# Patient Record
Sex: Female | Born: 1985 | Race: White | Hispanic: No | Marital: Single | Smoking: Former smoker
Health system: Southern US, Community
[De-identification: ages and names within clinical notes are randomized; demographics above are authoritative.]

## PROBLEM LIST (undated history)

## (undated) DIAGNOSIS — I10 Essential (primary) hypertension: Secondary | ICD-10-CM

## (undated) DIAGNOSIS — F1910 Other psychoactive substance abuse, uncomplicated: Secondary | ICD-10-CM

## (undated) HISTORY — PX: WISDOM TOOTH EXTRACTION: SHX21

## (undated) HISTORY — DX: Essential (primary) hypertension: I10

## (undated) HISTORY — DX: Other psychoactive substance abuse, uncomplicated: F19.10

---

## 2006-10-24 ENCOUNTER — Emergency Department (HOSPITAL_COMMUNITY): Admission: EM | Admit: 2006-10-24 | Discharge: 2006-10-24 | Payer: Self-pay | Admitting: Emergency Medicine

## 2007-05-05 ENCOUNTER — Ambulatory Visit: Payer: Self-pay | Admitting: Emergency Medicine

## 2009-06-18 ENCOUNTER — Inpatient Hospital Stay (HOSPITAL_COMMUNITY): Admission: AD | Admit: 2009-06-18 | Discharge: 2009-06-21 | Payer: Self-pay | Admitting: Obstetrics and Gynecology

## 2009-06-20 ENCOUNTER — Encounter (INDEPENDENT_AMBULATORY_CARE_PROVIDER_SITE_OTHER): Payer: Self-pay | Admitting: Obstetrics and Gynecology

## 2011-02-16 LAB — COMPREHENSIVE METABOLIC PANEL
ALT: 12 U/L (ref 0–35)
AST: 21 U/L (ref 0–37)
Alkaline Phosphatase: 151 U/L — ABNORMAL HIGH (ref 39–117)
BUN: 2 mg/dL — ABNORMAL LOW (ref 6–23)
CO2: 26 mEq/L (ref 19–32)
Calcium: 8.3 mg/dL — ABNORMAL LOW (ref 8.4–10.5)
Chloride: 107 mEq/L (ref 96–112)
Creatinine, Ser: 0.64 mg/dL (ref 0.4–1.2)
GFR calc Af Amer: >60 mL/min (ref >60)
GFR calc non Af Amer: >60 mL/min (ref >60)
Glucose, Bld: 90 mg/dL (ref 70–99)
Glucose, Bld: 92 mg/dL (ref 70–99)
Potassium: 2.8 mEq/L — ABNORMAL LOW (ref 3.5–5.1)
Sodium: 137 mEq/L (ref 135–145)
Total Bilirubin: 0.4 mg/dL (ref 0.3–1.2)
Total Protein: 6 g/dL (ref 6.0–8.3)

## 2011-02-16 LAB — CBC
HCT: 33.2 % — ABNORMAL LOW (ref 36.0–46.0)
Hemoglobin: 10.6 g/dL — ABNORMAL LOW (ref 12.0–15.0)
Hemoglobin: 11.3 g/dL — ABNORMAL LOW (ref 12.0–15.0)
Hemoglobin: 9 g/dL — ABNORMAL LOW (ref 12.0–15.0)
MCHC: 34 g/dL (ref 30.0–36.0)
MCV: 89.3 fL (ref 78.0–100.0)
RBC: 2.95 MIL/uL — ABNORMAL LOW (ref 3.87–5.11)
RBC: 3.49 MIL/uL — ABNORMAL LOW (ref 3.87–5.11)
RBC: 3.72 MIL/uL — ABNORMAL LOW (ref 3.87–5.11)
RDW: 14.1 % (ref 11.5–15.5)

## 2011-02-16 LAB — LACTATE DEHYDROGENASE: LDH: 117 U/L (ref 94–250)

## 2011-02-16 LAB — URIC ACID: Uric Acid, Serum: 5.3 mg/dL (ref 2.4–7.0)

## 2017-12-21 ENCOUNTER — Emergency Department (HOSPITAL_COMMUNITY)
Admission: EM | Admit: 2017-12-21 | Discharge: 2017-12-21 | Discharge status: Against Medical Advice | Payer: Self-pay | Attending: Emergency Medicine | Admitting: Emergency Medicine

## 2017-12-21 ENCOUNTER — Other Ambulatory Visit: Payer: Self-pay

## 2017-12-21 DIAGNOSIS — R51 Headache: Secondary | ICD-10-CM | POA: Insufficient documentation

## 2017-12-21 DIAGNOSIS — M6281 Muscle weakness (generalized): Secondary | ICD-10-CM | POA: Insufficient documentation

## 2017-12-21 DIAGNOSIS — R569 Unspecified convulsions: Secondary | ICD-10-CM | POA: Insufficient documentation

## 2017-12-21 NOTE — ED Provider Notes (Signed)
[facility] EMERGENCY DEPARTMENT Provider Note   CSN: [id number] Arrival date & time: 12/21/17  1417     History   Chief Complaint Chief Complaint  Patient presents with  . Loss of Consciousness    HPI Madelin RearShelley Carlin is a 32 y.o. female.  The history is provided by the patient and medical records. No language interpreter was used.  Loss of Consciousness   Associated symptoms include headaches, seizures and weakness.   Madelin RearShelley Bashor is a 32 y.o. female who presents to ER for seizure-like activity just prior to arrival. Witnesses report that she started shaking like a seizure and passed out. No history of seizures or LOC. No medications given PTA for symptoms.   No past medical history on file.  There are no active problems to display for this patient.    OB History    No data available       Home Medications    Prior to Admission medications   Not on File    Family History No family history on file.  Social History Social History   Tobacco Use  . Smoking status: Not on file  Substance Use Topics  . Alcohol use: Not on file  . Drug use: Not on file     Allergies   Patient has no known allergies.   Review of Systems Review of Systems  Cardiovascular: Positive for syncope.  Musculoskeletal: Negative for arthralgias and myalgias.  Neurological: Positive for seizures, weakness and headaches.     Physical Exam Updated Vital Signs BP 118/70 (BP Location: Right Arm)   Pulse 95   Temp 98.5 F (36.9 C) (Oral)   Resp 18   SpO2 98%   Physical Exam  Constitutional: She is oriented to person, place, and time. She appears well-developed and well-nourished. No distress.  HENT:  Head: Normocephalic and atraumatic.  Eyes: EOM are normal.  Neck: Neck supple.  Neurological: She is alert and oriented to person, place, and time.  Skin: Skin is warm and dry.  Psychiatric: She has a normal mood and affect.  Nursing note and vitals  reviewed.    ED Treatments / Results  Labs (all labs ordered are listed, but only abnormal results are displayed) Labs Reviewed - No data to display  EKG  EKG Interpretation None       Radiology No results found.  Procedures Procedures (including critical care time)  Medications Ordered in ED Medications - No data to display   Initial Impression / Assessment and Plan / ED Course  I have reviewed the triage vital signs and the nursing notes.  Pertinent labs & imaging results that were available during my care of the patient were reviewed by me and considered in my medical decision making (see chart for details).    Patient just brought in by EMS for syncopal episode. Nursing staff was attempting to place patient in room, however she would like to leave. I came to evaluate her at bedside. Bystanders informed her that she had a seizure. She has no history of seizures or previous syncopal episodes. I have discussed my concerns as a provider given new onset of seizure vs. Syncopal episodes. We discussed the nature, risks and benefits. I have specifically discussed that without further evaluation I cannot guarantee there is not a life threatening event occuring.  Time was given to allow the opportunity to ask questions and consider the options, and after the discussion, the patient decided to refuse further evaluation. Pt is  A&Ox4, her own POA and states understanding of my concerns and the possible consequences. After refusal, I made every reasonable opportunity to treat them to the best of my ability. I have made the patient aware that this is an AMA discharge, but he may return at any time for further evaluation and treatment.  Final Clinical Impressions(s) / ED Diagnoses   Final diagnoses:  None    ED Discharge Orders    None       Ward, Chase Picket, PA-C 12/21/17 1619    Azalia Bilis, MD 12/28/17 [phone]

## 2017-12-21 NOTE — ED Triage Notes (Signed)
[facility] arrives via EMs from [facility] [employer] where pt recalled getting lightheaded, fell face forward, assisted to the ground by coworkers who noted generalized tonic/clonic jerking for approx 1.395min. No previous hx of seizures. On EMS arrival, pt alert, oriented x2. CBg 101. Blood noted to left nare. C-colllar in place. Pt alert oriented x4, at present. 20g LAC. Wants to sign out AMA due to not having health insurance. VSS.

## 2017-12-21 NOTE — ED Provider Notes (Deleted)
Patient just brought in by EMS for syncopal episode. Nursing staff was attempting to place patient in room, however she would like to leave. I came to evaluate her at bedside. Bystanders informed her that she had a seizure. She has no history of seizures or previous syncopal episodes. I have discussed my concerns as a provider given new onset of seizure vs. Syncopal episodes. We discussed the nature, risks and benefits. I have specifically discussed that without further evaluation I cannot guarantee there is not a life threatening event occuring.  Time was given to allow the opportunity to ask questions and consider the options, and after the discussion, the patient decided to refuse further evaluation. Pt is A&Ox4, her own POA and states understanding of my concerns and the possible consequences. After refusal, I made every reasonable opportunity to treat them to the best of my ability. I have made the patient aware that this is an AMA discharge, but he may return at any time for further evaluation and treatment.   Dafne Nield, Chase PicketJaime Pilcher, PA-C 12/21/17 [phone]

## 2017-12-21 NOTE — ED Notes (Signed)
Patient states that she does not want to be seen because she does not have heath insurance. Nurse discussed concerns and asked if provider could speak with her. Provider Asher MuirJamie at bedside.

## 2018-08-16 DIAGNOSIS — F411 Generalized anxiety disorder: Secondary | ICD-10-CM

## 2018-08-16 DIAGNOSIS — F1020 Alcohol dependence, uncomplicated: Secondary | ICD-10-CM | POA: Insufficient documentation

## 2018-08-16 DIAGNOSIS — F3162 Bipolar disorder, current episode mixed, moderate: Secondary | ICD-10-CM

## 2018-08-27 ENCOUNTER — Encounter: Payer: Self-pay | Admitting: Physician Assistant

## 2018-08-27 ENCOUNTER — Ambulatory Visit (INDEPENDENT_AMBULATORY_CARE_PROVIDER_SITE_OTHER): Payer: Self-pay | Admitting: Physician Assistant

## 2018-08-27 DIAGNOSIS — Z915 Personal history of self-harm: Secondary | ICD-10-CM

## 2018-08-27 DIAGNOSIS — Z9151 Personal history of suicidal behavior: Secondary | ICD-10-CM

## 2018-08-27 DIAGNOSIS — Z87898 Personal history of other specified conditions: Secondary | ICD-10-CM

## 2018-08-27 DIAGNOSIS — F411 Generalized anxiety disorder: Secondary | ICD-10-CM

## 2018-08-27 DIAGNOSIS — F1991 Other psychoactive substance use, unspecified, in remission: Secondary | ICD-10-CM | POA: Insufficient documentation

## 2018-08-27 DIAGNOSIS — F319 Bipolar disorder, unspecified: Secondary | ICD-10-CM

## 2018-08-27 MED ORDER — NALTREXONE HCL 50 MG PO TABS: 50.0000 mg | ORAL_TABLET | Freq: Every day | ORAL | 0 refills | Status: DC

## 2018-08-27 MED ORDER — QUETIAPINE FUMARATE 200 MG PO TABS: 200.0000 mg | ORAL_TABLET | Freq: Every day | ORAL | 0 refills | Status: DC
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
## 2018-09-02 ENCOUNTER — Ambulatory Visit: Payer: Self-pay | Admitting: Psychiatry

## 2018-09-02 DIAGNOSIS — F411 Generalized anxiety disorder: Secondary | ICD-10-CM
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
## 2018-09-06 ENCOUNTER — Encounter: Payer: Self-pay | Admitting: Psychiatry

## 2018-09-21 ENCOUNTER — Encounter

## 2018-09-21 ENCOUNTER — Ambulatory Visit (INDEPENDENT_AMBULATORY_CARE_PROVIDER_SITE_OTHER): Payer: Self-pay | Admitting: Psychiatry

## 2018-09-21 DIAGNOSIS — F411 Generalized anxiety disorder: Secondary | ICD-10-CM

## 2018-09-21 NOTE — Progress Notes (Signed)
      [facility] Counselor/Therapist Progress Note   Patient ID: Kelly Moses, MRN: [id number]  Date: 09/21/2018  Timespent: 555   Treatment Type: Individual   Reported Symptoms: anxiety, overwhelmed   Mental Status Exam:    Appearance:   Casual     Behavior:  Appropriate and Sharing  Motor:  Normal  Speech/Language:   Normal Rate  Affect:  Congruent  Mood:  anxious and depressed  Thought process:  normal  Thought content:    WNL  Sensory/Perceptual disturbances:    WNL  Orientation:  oriented to person, place, time/date, situation, day of week, month of year and year  Attention:  Good  Concentration:  Good  Memory:  WNL  Fund of knowledge:   Good  Insight:    Good  Judgment:   Good  Impulse Control:  Good     Risk Assessment: Danger to Self:  No Self-injurious Behavior: No Danger to Others: No Duty to Warn:no Physical Aggression / Violence:No  Access to Firearms a concern: No  Gang Involvement:No    Subjective: Patient in today with anxiety and feeling overwhelmed.  Has remained sober however is very behind in bills and other responsibilities.  Talked about steps she can take to take care of such personal business items and where to get more information.  Seems to be doing real well with her 269 yr old daughter and daughter's dad is doing a great job of co-parenting.    Interventions: Solution-Oriented/Positive Psychology and Ego-Supportive   Diagnosis:   ICD-10-CM   1. 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  Will work on specific communication with 569 yr old daughter per our conversation today.  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  Work on prioritizing the areas that need her attention the most as she works to get caught up.  Encouraged to keep attending her daily AA meetings as she has stayed sober 2 months.   Mathis Fareeborah Cigi Bega,  LCSW

## 2018-10-14 ENCOUNTER — Encounter: Payer: Self-pay | Admitting: Emergency Medicine

## 2018-10-27 ENCOUNTER — Ambulatory Visit: Payer: Self-pay | Admitting: Physician Assistant

## 2018-11-05 ENCOUNTER — Ambulatory Visit (INDEPENDENT_AMBULATORY_CARE_PROVIDER_SITE_OTHER): Payer: Self-pay | Admitting: Psychiatry

## 2018-11-05 DIAGNOSIS — F411 Generalized anxiety disorder: Secondary | ICD-10-CM

## 2018-11-05 NOTE — Progress Notes (Signed)
      [facility] Counselor/Therapist Progress Note  Patient ID: Kelly Moses, MRN: [id number],    Date: 11/05/2018  Time Spent:  50 minutes  Treatment Type: Individual Therapy  Reported Symptoms: anxiety, some depression,   Mental Status Exam:  Appearance:   Neat     Behavior:  Appropriate and Sharing  Motor:  Normal  Speech/Language:   Normal Rate  Affect:  Congruent  Mood:  anxious, depressed and irritable  Thought process:  normal  Thought content:    WNL  Sensory/Perceptual disturbances:    WNL  Orientation:  oriented to person, place, time/date, situation, day of week, month of year and year  Attention:  Good  Concentration:  Good  Memory:  WNL  Fund of knowledge:   Good  Insight:    Good  Judgment:   Good  Impulse Control:  Good   Risk Assessment: Danger to Self:  No Self-injurious Behavior: No Danger to Others: No Duty to Warn:no Physical Aggression / Violence:No  Access to Firearms a concern: No  Gang Involvement:No   Subjective:  Patient in today with anxiety and some depression---she reports depression seemed to be related to holidays and some feelings of overwhelmedness. Things still going ok with daughter's dad and coparenting, although has had some arguing in front of 4th grade daughter. Worked on ways of better handling her anger and frustration, and not leave home in the midst of it, especially as she is maintaining her sobriety of 90 days.  She is to come up with a list of possible activities that may help her in her angrier times as well as how her communication with co-parent could improve, enabling her to be better heard and understood by him.  Working on how she can reduce her feelings of overwhelmedness and be realistic in getting things done, versus expecting to get everything done in short time frame.  Will journal re: some of these changes as well as when she is very Airline pilotfrustrated/angry.  Wants to be more physically active in joining gym and taking yoga  classes.  Interventions: Solution-Oriented/Positive Psychology and Ego-Supportive  Diagnosis:   ICD-10-CM   1. Generalized anxiety disorder F41.1     Plan:  Patient to follow up on above mentioned strategies and will see again within a couple wks.  Mathis Fareeborah Dee Paden, LCSW

## 2018-11-18 ENCOUNTER — Ambulatory Visit: Payer: Self-pay | Admitting: Psychiatry

## 2018-11-29 ENCOUNTER — Encounter: Payer: Self-pay | Admitting: Physician Assistant

## 2018-11-29 ENCOUNTER — Ambulatory Visit: Payer: Self-pay | Admitting: Physician Assistant

## 2018-11-29 DIAGNOSIS — F319 Bipolar disorder, unspecified: Secondary | ICD-10-CM

## 2018-11-29 DIAGNOSIS — F1991 Other psychoactive substance use, unspecified, in remission: Secondary | ICD-10-CM

## 2018-11-29 DIAGNOSIS — F411 Generalized anxiety disorder: Secondary | ICD-10-CM

## 2018-11-29 DIAGNOSIS — Z87898 Personal history of other specified conditions: Secondary | ICD-10-CM

## 2018-11-29 MED ORDER — QUETIAPINE FUMARATE ER 300 MG PO TB24: 300.0000 mg | ORAL_TABLET | Freq: Every day | ORAL | 1 refills | Status: DC

## 2018-11-29 MED ORDER — NALTREXONE HCL 50 MG PO TABS: 50.0000 mg | ORAL_TABLET | Freq: Every day | ORAL | 5 refills | Status: DC

## 2018-11-29 NOTE — Progress Notes (Signed)
[facility] Med Check  Patient ID: Kelly Moses,  MRN: [id number]  PCP: Patient, No Pcp Per  Date of Evaluation: 11/29/2018 Time spent:15 minutes  Chief Complaint:  Chief Complaint    Follow-up      HISTORY/CURRENT STATUS: HPIHere for 3 month f/u med check.  Overall, she is doing great.  She has had no alcohol in 4 months.  Denies any drug use.  The only complaint is drowsiness.  She has a hard time getting out of the bed.  This worsened when she started the Seroquel.  States "I feel like I have ADD or something.  When I get up and for a few hours I feel like I cannot concentrate and it takes me longer to get out of bed and get going.  I get easily distracted and can keep my thoughts straight."  Her moods have been stable.  "I have my days like everybody else does.  Sometimes they get a little blue but for the most part, I am fine."  States she has those symptoms once a week or so.  Sometimes they are triggered and sometimes not.  Patient denies loss of interest in usual activities and is able to enjoy things.  Denies decreased energy or motivation. No extreme sadness, tearfulness, or feelings of hopelessness.  Denies any changes in concentration, making decisions or remembering things.  Denies suicidal or homicidal thoughts.  Patient denies increased energy with decreased need for sleep, no increased talkativeness, no racing thoughts, no impulsivity or risky behaviors, no increased spending, no increased libido, no grandiosity.  Work is going well.  She sleeps fine, but does want to sleep too much at times.  She is not sure if that is due to the Seroquel but thinks it is.  She is also been eating more and has probably gained about 10 pounds.  Individual Medical History/ Review of Systems: Changes? :No    Past medications for mental health diagnoses include: Lexapro, Paxil, Xanax from the street, Adderall, naltrexone, hydroxyzine, Seroquel  Allergies: Sulfa antibiotics  Current  Medications:  Current Outpatient Medications:  .  naltrexone (DEPADE) 50 MG tablet, Take 1 tablet (50 mg total) by mouth daily., Disp: 30 tablet, Rfl: 5 .  hydrOXYzine (ATARAX/VISTARIL) 25 MG tablet, Take 25 mg by mouth 3 (three) times daily as needed., Disp: , Rfl:  .  QUEtiapine (SEROQUEL XR) 300 MG 24 hr tablet, Take 1 tablet (300 mg total) by mouth at bedtime., Disp: 30 tablet, Rfl: 1 Medication Side Effects: hypersomnolence  Family Medical/ Social History: Changes? No  MENTAL HEALTH EXAM:  There were no vitals taken for this visit.There is no height or weight on file to calculate BMI.  General Appearance: Casual and Well Groomed  Eye Contact:  Good  Speech:  Clear and Coherent  Volume:  Normal  Mood:  Euthymic  Affect:  Appropriate  Thought Process:  Goal Directed  Orientation:  Full (Time, Place, and Person)  Thought Content: Logical   Suicidal Thoughts:  No  Homicidal Thoughts:  No  Memory:  WNL  Judgement:  Good  Insight:  Good  Psychomotor Activity:  Normal  Concentration:  Concentration: Fair and Attention Span: Fair  Recall:  Good  Fund of Knowledge: Good  Language: Good  Assets:  Desire for Improvement  ADL's:  Intact  Cognition: WNL  Prognosis:  Good    DIAGNOSES:    ICD-10-CM   1. Bipolar I disorder (HCC) F31.9   2. Generalized anxiety disorder F41.1   3.  History of drug use disorder Z87.898   4. History of alcohol use disorder Z87.898     Receiving Psychotherapy: No    RECOMMENDATIONS: Increase Seroquel and change to XR 300 mg p.o. nightly.  I am hoping this will prevent the increased somnolence but also prevent the hangover effect in the mornings. Unfortunately, she does not have insurance, therefore the newer antipsychotics such as Vraylar and Rexulti are cost prohibitive.  They do have less side effects but are extremely expensive to pay out-of-pocket.  If the weight and/or extreme drowsiness continues to be an issue, I may need to change to Abilify,  Risperdal, or Geodon.  Those can also cause increased hunger and weight gain however. She is not needing the hydroxyzine.  She has it on hand if she does need it though. Continue naltrexone 50 mg p.o. every morning. Return in 6 to 8 weeks or sooner as needed.   Melony Overly, PA-C

## 2018-12-01 ENCOUNTER — Ambulatory Visit: Payer: Self-pay | Admitting: Psychiatry

## 2018-12-15 ENCOUNTER — Ambulatory Visit: Payer: Self-pay | Admitting: Psychiatry

## 2019-01-10 ENCOUNTER — Encounter: Payer: Self-pay | Admitting: Physician Assistant

## 2019-01-10 ENCOUNTER — Ambulatory Visit: Payer: Self-pay | Admitting: Physician Assistant

## 2019-01-10 DIAGNOSIS — F1991 Other psychoactive substance use, unspecified, in remission: Secondary | ICD-10-CM

## 2019-01-10 DIAGNOSIS — Z87898 Personal history of other specified conditions: Secondary | ICD-10-CM

## 2019-01-10 DIAGNOSIS — F319 Bipolar disorder, unspecified: Secondary | ICD-10-CM

## 2019-01-10 DIAGNOSIS — F411 Generalized anxiety disorder: Secondary | ICD-10-CM

## 2019-01-10 MED ORDER — NALTREXONE HCL 50 MG PO TABS: 50.0000 mg | ORAL_TABLET | Freq: Every day | ORAL | 0 refills | Status: DC

## 2019-01-10 MED ORDER — BUPROPION HCL ER (XL) 150 MG PO TB24: 150.0000 mg | ORAL_TABLET | Freq: Every day | ORAL | 0 refills | Status: DC

## 2019-01-10 MED ORDER — QUETIAPINE FUMARATE ER 300 MG PO TB24: 300.0000 mg | ORAL_TABLET | Freq: Every day | ORAL | 0 refills | Status: DC

## 2019-01-10 NOTE — Progress Notes (Signed)
[facility]  Patient ID: Kelly Moses,  MRN: [id number]  PCP: Patient, No Pcp Per  Date of Evaluation: 01/10/2019 Time spent:15 minutes  Chief Complaint:  Chief Complaint    Follow-up      HISTORY/CURRENT STATUS: HPI  Here for routine med check.  States she is doing much better.  The Seroquel XR is working great!  She was really drowsy from it when she first started it but that has gotten much better with her.  Her mood is good.  She denies increased energy with decreased need for sleep, no impulsivity, risky behavior or increased spending.  No increased libido or grandiosity.  Since being on the Seroquel, states she has gained about 15 pounds.  She finds that she is eating during the night and does not even remember it.  She also has decreased libido since being on this medication.  Patient denies loss of interest in usual activities and is able to enjoy things.  Denies decreased energy or motivation.  Appetite has not changed.  No extreme sadness, tearfulness, or feelings of hopelessness.  Denies any changes in concentration, making decisions or remembering things.  Denies suicidal or homicidal thoughts.  She sleeps well.  She is excited to be going on a [place] vacation next month and then will also go on a cruise.  He denies any alcohol or drug use.  Individual Medical History/ Review of Systems: Changes? :No    Past medications for mental health diagnoses include: Lexapro, Paxil, Xanax from the street, Adderall, naltrexone, hydroxyzine, Seroquel  Allergies: Sulfa antibiotics  Current Medications:  Current Outpatient Medications:  .  hydrOXYzine (ATARAX/VISTARIL) 25 MG tablet, Take 25 mg by mouth 3 (three) times daily as needed., Disp: , Rfl:  .  naltrexone (DEPADE) 50 MG tablet, Take 1 tablet (50 mg total) by mouth daily., Disp: 90 tablet, Rfl: 0 .  QUEtiapine (SEROQUEL XR) 300 MG 24 hr tablet, Take 1 tablet (300 mg total) by mouth at bedtime., Disp: 90 tablet,  Rfl: 0 .  buPROPion (WELLBUTRIN XL) 150 MG 24 hr tablet, Take 1 tablet (150 mg total) by mouth daily., Disp: 90 tablet, Rfl: 0 Medication Side Effects: sexual dysfunction  Family Medical/ Social History: Changes? No  MENTAL HEALTH EXAM:  There were no vitals taken for this visit.There is no height or weight on file to calculate BMI.  General Appearance: Casual and Well Groomed  Eye Contact:  Good  Speech:  Clear and Coherent  Volume:  Normal  Mood:  Euthymic  Affect:  Appropriate  Thought Process:  Goal Directed  Orientation:  Full (Time, Place, and Person)  Thought Content: Logical   Suicidal Thoughts:  No  Homicidal Thoughts:  No  Memory:  WNL  Judgement:  Good  Insight:  Good  Psychomotor Activity:  Normal  Concentration:  Concentration: Good  Recall:  Good  Fund of Knowledge: Good  Language: Good  Assets:  Desire for Improvement  ADL's:  Intact  Cognition: WNL  Prognosis:  Good    DIAGNOSES:    ICD-10-CM   1. Bipolar I disorder (HCC) F31.9   2. Generalized anxiety disorder F41.1   3. History of drug use Z87.898   4. History of alcohol use disorder Z87.898     Receiving Psychotherapy: No    RECOMMENDATIONS: We discussed different options to help with the weight gain as well as decreased libido.  One option would be to start metformin but I think a better option would be Wellbutrin.  She  has no history of eating disorders or seizure disorder.  After discussing the benefits, risks, side effects, she prefers to do Wellbutrin and agrees to starting it. Start Wellbutrin XL 150 mg p.o. every morning. Continue Seroquel XR 300 mg nightly Continue naltrexone 50 mg p.o. every morning. Continue hydroxyzine 25 mg 3 times daily as needed.  She rarely uses it. Return in 6 to 8 weeks.  Melony Overly, PA-C   This record has been created using [employer].  Chart creation errors have been sought, but may not always have been located and corrected. Such creation errors do  not reflect on the standard of medical care.

## 2019-02-24 ENCOUNTER — Other Ambulatory Visit: Payer: Self-pay

## 2019-02-24 ENCOUNTER — Ambulatory Visit: Payer: Self-pay | Admitting: Physician Assistant

## 2019-04-08 ENCOUNTER — Ambulatory Visit: Payer: Self-pay | Admitting: Physician Assistant

## 2019-04-08 ENCOUNTER — Other Ambulatory Visit: Payer: Self-pay

## 2019-04-13 ENCOUNTER — Ambulatory Visit: Payer: Self-pay | Admitting: Physician Assistant

## 2019-04-13 ENCOUNTER — Encounter: Payer: Self-pay | Admitting: Physician Assistant

## 2019-04-13 ENCOUNTER — Other Ambulatory Visit: Payer: Self-pay

## 2019-04-13 DIAGNOSIS — Z87898 Personal history of other specified conditions: Secondary | ICD-10-CM

## 2019-04-13 DIAGNOSIS — F411 Generalized anxiety disorder: Secondary | ICD-10-CM

## 2019-04-13 DIAGNOSIS — F319 Bipolar disorder, unspecified: Secondary | ICD-10-CM

## 2019-04-13 DIAGNOSIS — F1991 Other psychoactive substance use, unspecified, in remission: Secondary | ICD-10-CM

## 2019-04-13 DIAGNOSIS — K5903 Drug induced constipation: Secondary | ICD-10-CM

## 2019-04-13 MED ORDER — BUPROPION HCL ER (XL) 150 MG PO TB24: 150.0000 mg | ORAL_TABLET | Freq: Every day | ORAL | 0 refills | Status: DC

## 2019-04-13 MED ORDER — QUETIAPINE FUMARATE ER 300 MG PO TB24: 300.0000 mg | ORAL_TABLET | Freq: Every day | ORAL | 0 refills | Status: DC

## 2019-04-13 MED ORDER — NALTREXONE HCL 50 MG PO TABS: 50.0000 mg | ORAL_TABLET | Freq: Every day | ORAL | 0 refills | Status: DC

## 2019-04-13 NOTE — Progress Notes (Signed)
[facility]  Patient ID: Kelly Moses,  MRN: [id number]  PCP: Patient, No Pcp Per  Date of Evaluation: 04/13/2019 Time spent:15 minutes  Chief Complaint:  Chief Complaint    Follow-up      HISTORY/CURRENT STATUS: HPI For 3 month med check.  Has quit smoking! Thinks the Wellbutrin has helped with that.   Doing well mentally too. Patient denies loss of interest in usual activities and is able to enjoy things.  Denies decreased energy or motivation.  Appetite has not changed.  No extreme sadness, tearfulness, or feelings of hopelessness.  Denies any changes in concentration, making decisions or remembering things.  Denies suicidal or homicidal thoughts.  Patient denies increased energy with decreased need for sleep, no increased talkativeness, no racing thoughts, no impulsivity or risky behaviors, no increased spending, no increased libido, no grandiosity.  Denies drug use.  She has had 1 beer a month for the past 3 months.  She has not been craving them.  She has gained some weight with the Seroquel but has stabilized and seems to not be gaining any more since she is on the Wellbutrin and the naltrexone.  Denies dizziness, syncope, seizures, numbness, tingling, tremor, tics, unsteady gait, slurred speech, confusion. Denies muscle or joint pain, stiffness, or dystonia.  Individual Medical History/ Review of Systems: Changes? :Yes Constipation  Allergies: Sulfa antibiotics  Current Medications:  Current Outpatient Medications:  .  buPROPion (WELLBUTRIN XL) 150 MG 24 hr tablet, Take 1 tablet (150 mg total) by mouth daily., Disp: 90 tablet, Rfl: 0 .  hydrOXYzine (ATARAX/VISTARIL) 25 MG tablet, Take 25 mg by mouth 3 (three) times daily as needed., Disp: , Rfl:  .  naltrexone (DEPADE) 50 MG tablet, Take 1 tablet (50 mg total) by mouth daily., Disp: 90 tablet, Rfl: 0 .  QUEtiapine (SEROQUEL XR) 300 MG 24 hr tablet, Take 1 tablet (300 mg total) by mouth at bedtime., Disp: 90  tablet, Rfl: 0 Medication Side Effects: constipation  Family Medical/ Social History: Changes? Yes out of work now.  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  MENTAL HEALTH EXAM:  There were no vitals taken for this visit.There is no height or weight on file to calculate BMI.  General Appearance: Casual  Eye Contact:  Good  Speech:  Clear and Coherent  Volume:  Normal  Mood:  Euthymic  Affect:  Appropriate  Thought Process:  Goal Directed  Orientation:  Full (Time, Place, and Person)  Thought Content: Logical   Suicidal Thoughts:  No  Homicidal Thoughts:  No  Memory:  WNL  Judgement:  Good  Insight:  Good  Psychomotor Activity:  Normal  Concentration:  Concentration: Good  Recall:  Good  Fund of Knowledge: Good  Language: Good  Assets:  Desire for Improvement  ADL's:  Intact  Cognition: WNL  Prognosis:  Good    DIAGNOSES:    ICD-10-CM   1. Bipolar I disorder (HCC) F31.9   2. Generalized anxiety disorder F41.1   3. History of drug use Z87.898   4. History of alcohol use disorder Z87.898     Receiving Psychotherapy: No    RECOMMENDATIONS:  For the constipation I recommend increase fiber in the diet, gradually up to 30 g daily.  We discussed different foods especially beans like Pinn toes or Northern beans,etc have a lot of fiber.  Also prunes, green leafy vegetables etc.  She can google high-fiber foods.  Also  MiraLAX is good if needed for a few times but do not use daily.  Using a stool softener may be helpful.  Metamucil daily.  Increase fluid intake. She needs to have glucose and lipid panel.  Right now she does not have insurance and is not working.  We will hold off on those labs for now.  She knows to let me know if she gets symptomatic with increased thirst or urination especially. Continue Wellbutrin XL 150 mg daily. Continue hydroxyzine 25 mg 3 times daily as needed.  She rarely uses. Continue  naltrexone 50 mg daily. Continue Seroquel XR 300 mg nightly. Return in 3 months.  Melony Overly, PA-C   This record has been created using [employer].  Chart creation errors have been sought, but may not always have been located and corrected. Such creation errors do not reflect on the standard of medical care.

## 2019-07-08 ENCOUNTER — Telehealth: Payer: Self-pay | Admitting: Physician Assistant

## 2019-07-08 ENCOUNTER — Other Ambulatory Visit: Payer: Self-pay

## 2019-07-08 MED ORDER — BUPROPION HCL ER (XL) 150 MG PO TB24: 150.0000 mg | ORAL_TABLET | Freq: Every day | ORAL | 0 refills | Status: DC

## 2019-07-08 NOTE — Telephone Encounter (Signed)
Refill sent per request to [facility]

## 2019-07-08 NOTE — Telephone Encounter (Signed)
Pt RS appt for 10/1 wanted to wait to see TH, but needed a refill for Wellbutrin. Stated only 6 pills remain. Fill at the [facility] on [address].

## 2019-07-14 ENCOUNTER — Ambulatory Visit: Payer: Self-pay | Admitting: Physician Assistant

## 2019-08-08 ENCOUNTER — Other Ambulatory Visit: Payer: Self-pay | Admitting: Physician Assistant

## 2019-08-11 ENCOUNTER — Ambulatory Visit: Payer: Self-pay | Admitting: Physician Assistant

## 2019-09-08 ENCOUNTER — Other Ambulatory Visit: Payer: Self-pay

## 2019-09-08 ENCOUNTER — Ambulatory Visit (INDEPENDENT_AMBULATORY_CARE_PROVIDER_SITE_OTHER): Payer: Self-pay | Admitting: Physician Assistant

## 2019-09-08 ENCOUNTER — Encounter: Payer: Self-pay | Admitting: Physician Assistant

## 2019-09-08 DIAGNOSIS — F32A Depression, unspecified: Secondary | ICD-10-CM

## 2019-09-08 DIAGNOSIS — Z87898 Personal history of other specified conditions: Secondary | ICD-10-CM

## 2019-09-08 DIAGNOSIS — F1991 Other psychoactive substance use, unspecified, in remission: Secondary | ICD-10-CM

## 2019-09-08 DIAGNOSIS — F329 Major depressive disorder, single episode, unspecified: Secondary | ICD-10-CM

## 2019-09-08 DIAGNOSIS — K5903 Drug induced constipation: Secondary | ICD-10-CM

## 2019-09-08 DIAGNOSIS — G4700 Insomnia, unspecified: Secondary | ICD-10-CM

## 2019-09-08 MED ORDER — BUPROPION HCL ER (XL) 300 MG PO TB24: 300.0000 mg | ORAL_TABLET | Freq: Every day | ORAL | 1 refills | Status: DC

## 2019-09-08 MED ORDER — QUETIAPINE FUMARATE 100 MG PO TABS: ORAL_TABLET | ORAL | 0 refills | Status: DC

## 2019-09-08 MED ORDER — NALTREXONE HCL 50 MG PO TABS: 50.0000 mg | ORAL_TABLET | Freq: Every day | ORAL | 0 refills | Status: DC

## 2019-09-08 NOTE — Progress Notes (Signed)
[facility]  Patient ID: Kelly Moses,  MRN: [id number]  PCP: Patient, No Pcp Per  Date of Evaluation: 09/08/2019 Time spent:15 minutes  Chief Complaint:  Chief Complaint    Follow-up     Virtual Visit via Telephone Note  I connected with patient by a video enabled telemedicine application or telephone, with their informed consent, and verified patient privacy and that I am speaking with the correct person using two identifiers.  I am private, in my office and the patient is home.  I discussed the limitations, risks, security and privacy concerns of performing an evaluation and management service by telephone and the availability of in person appointments. I also discussed with the patient that there may be a patient responsible charge related to this service. The patient expressed understanding and agreed to proceed.   I discussed the assessment and treatment plan with the patient. The patient was provided an opportunity to ask questions and all were answered. The patient agreed with the plan and demonstrated an understanding of the instructions.   The patient was advised to call back or seek an in-person evaluation if the symptoms worsen or if the condition fails to improve as anticipated.  I provided 15 minutes of non-face-to-face time during this encounter.  HISTORY/CURRENT STATUS: HPI For routine med check.  Patient states she is sluggish a lot.  Reports if she could, she would stay in bed all the time.  "I do not because I have other things to do but it would not bother me a bit if I did stay in bed.  The Seroquel has really helped my mood and it helps me sleep but I have probably gained 30 pounds in the past year.  I exercise and eat right but it does not seem to matter.  I still have a lot of constipation with that as well."  One of her uncles is a physician and he recommended she ask about Vyvanse.  She did take Adderall in the past for ADD.  Able to enjoy  things.  Energy and motivation are low. Doesn't cry easily. No SI/HI.  Patient denies increased energy with decreased need for sleep, no increased talkativeness, no racing thoughts, no impulsivity or risky behaviors, no increased spending, no increased libido, no grandiosity.  Denies hallucinations.  Upon further questioning, she is not really sure if she is ever had manic symptoms.  When she was younger, she did drink and "did stupid things" but does not report any other manic symptoms.    Not really having a lot of anxiety and does not often take the hydroxyzine.  Not working outside the home.  Denies dizziness, syncope, seizures, numbness, tingling, tremor, tics, unsteady gait, slurred speech, confusion. Denies muscle or joint pain, stiffness, or dystonia.  Individual Medical History/ Review of Systems: Changes? :No    Past medications for mental health diagnoses include: Lexapro, Paxil, Xanax from the street, Adderall, naltrexone, hydroxyzine, Seroquel  Allergies: Sulfa antibiotics  Current Medications:  Current Outpatient Medications:  .  hydrOXYzine (ATARAX/VISTARIL) 25 MG tablet, Take 25 mg by mouth 3 (three) times daily as needed., Disp: , Rfl:  .  naltrexone (DEPADE) 50 MG tablet, Take 1 tablet (50 mg total) by mouth daily., Disp: 90 tablet, Rfl: 0 .  buPROPion (WELLBUTRIN XL) 300 MG 24 hr tablet, Take 1 tablet (300 mg total) by mouth daily., Disp: 30 tablet, Rfl: 1 .  QUEtiapine (SEROQUEL) 100 MG tablet, 2 po qhs for 1 wk, then 1.5 pills qhs  for 1 wk, then 1 po qhs for 1 wk, then 0.5 pill qhs for a wk then stop., Disp: 60 tablet, Rfl: 0 Medication Side Effects: constipation and weight gain  Family Medical/ Social History: Changes? No  MENTAL HEALTH EXAM:  There were no vitals taken for this visit.There is no height or weight on file to calculate BMI.  General Appearance: unable to assess  Eye Contact:  unable to assess  Speech:  Clear and Coherent  Volume:  Normal  Mood:   Euthymic  Affect:  unable to assess  Thought Process:  Goal Directed and Descriptions of Associations: Intact  Orientation:  Full (Time, Place, and Person)  Thought Content: Logical   Suicidal Thoughts:  No  Homicidal Thoughts:  No  Memory:  WNL  Judgement:  Good  Insight:  Good  Psychomotor Activity:  unable to assess  Concentration:  Concentration: Good  Recall:  Good  Fund of Knowledge: Good  Language: Good  Assets:  Desire for Improvement  ADL's:  Intact  Cognition: WNL  Prognosis:  Good    DIAGNOSES:    ICD-10-CM   1. Depression, unspecified depression type  F32.9   2. Drug-induced constipation  K59.03   3. Insomnia, unspecified type  G47.00   4. History of drug use disorder  Z87.898     Receiving Psychotherapy: Yes Rockne Menghini, LCSW   RECOMMENDATIONS:  We had a long discussion about her diagnosis.  Sometimes it is difficult to tell whether someone has bipolar disorder versus major depressive disorder with anxiety or dual diagnosis with alcohol or drug use.  I think it would be a good decision to wean her off of the Seroquel, or at least get as low as possible with her still able to sleep.  If she becomes manic or depressed, we can consider adding Lamictal and/or Trileptal, neither of which tend to cause a lot of weight gain.  She understands and would like to try this approach. Change Seroquel to instant release 100 mg, take 2 nightly for 1 week, 1.5 pills nightly for 1 week, 1 pill nightly for 1 week, half pill nightly for 1 week and then stop.  If she still needs further help, she can call and I will send in a prescription for 25 mg.  Hopefully at the lower dose she will not have increased hunger or weight gain. Increase Wellbutrin XL to 300 mg p.o. every morning. Continue naltrexone 50 mg daily. Continue hydroxyzine 25 mg tablets 3 times daily as needed.  She rarely takes. For now, I prefer not to add a stimulant on.  She does not have insurance and Vyvanse is super  expensive so we would most likely use Adderall anyway.  I would prefer not to add another drug in, and with her history of drug use, I would like to avoid controlled substances anyway. Continue therapy with Rockne Menghini, LCSW. Return in 4 to 6 weeks.  Melony Overly, PA-C

## 2019-11-14 ENCOUNTER — Other Ambulatory Visit: Payer: Self-pay | Admitting: Physician Assistant

## 2019-11-19 ENCOUNTER — Other Ambulatory Visit: Payer: Self-pay | Admitting: Physician Assistant

## 2019-11-25 ENCOUNTER — Ambulatory Visit (INDEPENDENT_AMBULATORY_CARE_PROVIDER_SITE_OTHER): Payer: Self-pay | Admitting: Physician Assistant

## 2019-11-25 ENCOUNTER — Encounter: Payer: Self-pay | Admitting: Physician Assistant

## 2019-11-25 DIAGNOSIS — G4700 Insomnia, unspecified: Secondary | ICD-10-CM

## 2019-11-25 DIAGNOSIS — F329 Major depressive disorder, single episode, unspecified: Secondary | ICD-10-CM

## 2019-11-25 DIAGNOSIS — Z87898 Personal history of other specified conditions: Secondary | ICD-10-CM

## 2019-11-25 DIAGNOSIS — F411 Generalized anxiety disorder: Secondary | ICD-10-CM

## 2019-11-25 DIAGNOSIS — F1991 Other psychoactive substance use, unspecified, in remission: Secondary | ICD-10-CM

## 2019-11-25 DIAGNOSIS — F32A Depression, unspecified: Secondary | ICD-10-CM

## 2019-11-25 MED ORDER — BUPROPION HCL ER (XL) 300 MG PO TB24: 300.0000 mg | ORAL_TABLET | Freq: Every day | ORAL | 0 refills | Status: DC

## 2019-11-25 MED ORDER — QUETIAPINE FUMARATE 100 MG PO TABS: 100.0000 mg | ORAL_TABLET | Freq: Every day | ORAL | 0 refills | Status: DC

## 2019-11-25 MED ORDER — NALTREXONE HCL 50 MG PO TABS: 50.0000 mg | ORAL_TABLET | Freq: Every day | ORAL | 0 refills | Status: DC

## 2019-11-25 NOTE — Progress Notes (Signed)
[facility]  Patient ID: Kelly Moses,  MRN: [id number]  PCP: Patient, No Pcp Per  Date of Evaluation: 11/25/2019 Time spent:20 minutes  Chief Complaint:  Chief Complaint    Follow-up     Virtual Visit via Telephone Note  I connected with patient by a video enabled telemedicine application or telephone, with their informed consent, and verified patient privacy and that I am speaking with the correct person using two identifiers.  I am private, in my home and the patient is home.  I discussed the limitations, risks, security and privacy concerns of performing an evaluation and management service by telephone and the availability of in person appointments. I also discussed with the patient that there may be a patient responsible charge related to this service. The patient expressed understanding and agreed to proceed.   I discussed the assessment and treatment plan with the patient. The patient was provided an opportunity to ask questions and all were answered. The patient agreed with the plan and demonstrated an understanding of the instructions.   The patient was advised to call back or seek an in-person evaluation if the symptoms worsen or if the condition fails to improve as anticipated.  I provided 20 minutes of non-face-to-face time during this encounter.  HISTORY/CURRENT STATUS: HPI For routine med check.  Doing great!  At the last visit, we tried to get her off of Seroquel, her preference but it did not go well.  She was not able to sleep so she went back on the Seroquel at 100 mg and is doing much better.  She sleeps well and feels rested when she gets up.  States she is able to enjoy things.  Energy and motivation are good.  She feels that the Wellbutrin has helped that a lot.  She is able to focus well for the most part.  "I have always been kind of spacey but I can handle it right now.  I am not in school or anything so it is not a big deal."  Memory is fine.   Denies suicidal or homicidal thoughts.  Reports having stopped the naltrexone.  Felt like she did not need it.  Has not been craving drugs and has had no use in several years.  She is going to the gym every day and working out for about 45 minutes.  States it makes her feel a lot better emotionally and physically.  Patient denies increased energy with decreased need for sleep, no increased talkativeness, no racing thoughts, no impulsivity or risky behaviors, no increased spending, no increased libido, no grandiosity.  Denies dizziness, syncope, seizures, numbness, tingling, tremor, tics, unsteady gait, slurred speech, confusion. Denies muscle or joint pain, stiffness, or dystonia.  Individual Medical History/ Review of Systems: Changes? 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   Past medications for mental health diagnoses include: Lexapro, Paxil, Xanax from the street, Adderall, naltrexone, hydroxyzine, Seroquel  Allergies: Sulfa antibiotics  Current Medications:  Current Outpatient Medications:  .  amphetamine-dextroamphetamine (ADDERALL) 20 MG tablet, Take 20 mg by mouth 3 (three) times daily., Disp: , Rfl:  .  buPROPion (WELLBUTRIN XL) 300 MG 24 hr tablet, Take 1 tablet (300 mg total) by mouth daily., Disp: 90 tablet, Rfl: 0 .  hydrOXYzine (ATARAX/VISTARIL) 25 MG tablet, Take 25 mg by mouth 3 (three) times daily as needed., Disp: , Rfl:  .  naltrexone (DEPADE) 50 MG tablet, Take 1 tablet (50 mg total) by mouth daily., Disp: 90 tablet, Rfl: 0 .  QUEtiapine (SEROQUEL) 100 MG tablet, Take 1 tablet (100 mg total) by mouth at bedtime., Disp: 90 tablet, Rfl: 0 Medication Side Effects: none  Family Medical/ Social History: Changes? No  MENTAL HEALTH EXAM:  There were no vitals taken for this visit.There is no height or weight on file to calculate BMI.  General Appearance: unable to assess  Eye Contact:  unable to assess  Speech:  Clear and Coherent  Volume:  Normal  Mood:  Euthymic   Affect:  unable to assess  Thought Process:  Goal Directed and Descriptions of Associations: Intact  Orientation:  Full (Time, Place, and Person)  Thought Content: Logical   Suicidal Thoughts:  No  Homicidal Thoughts:  No  Memory:  WNL  Judgement:  Good  Insight:  Good  Psychomotor Activity:  unable to assess  Concentration:  Concentration: Good and Attention Span: Good  Recall:  Good  Fund of Knowledge: Good  Language: Good  Assets:  Desire for Improvement  ADL's:  Intact  Cognition: WNL  Prognosis:  Good    DIAGNOSES:    ICD-10-CM   1. Depression, unspecified depression type  F32.9   2. Insomnia, unspecified type  G47.00   3. History of drug use disorder  Z87.898   4. Generalized anxiety disorder  F41.1     Receiving Psychotherapy: No     RECOMMENDATIONS:  She seems to be doing really well!  I am glad to see that. PDMP was reviewed after our appointment.  I see that Dr. Chucky May has prescribed Adderall.  Lollie Marrow and I discussed this in detail at her last visit and I told her that with her history of drug use it is not in her best interest to use a controlled substance.  After I saw this information, I called the patient back and she admitted that she had gotten this prescription from Dr. Toy Care.  I told her that she can not get psychiatric drugs from 2 different practices, especially controlled substances, and that I need her to be honest with me, as she did not report this when I was going over her medications with her.  She needs to choose to either see me or Dr. Toy Care, but I will not prescribe a stimulant.  Patient states that she would rather come to me and she apologized for what she had done stating "it was probably not a good idea."  She agreed not to get further stimulants from Dr. Toy Care, and she wants to see me in 3 months as we discussed previously on the phone today.  I told her that I will check the [facility] registry at that visit and if I see that she is  getting a controlled substance for psychiatric reasons from any other provider, then I will no longer see her and she can get all of her psych medicines from that person.  She verbalizes understanding and said "that is fair."   Discontinue Adderall. Continue hydroxyzine 25 mg 3 times daily as needed. Continue Wellbutrin XL 300 mg daily. Continue Seroquel 100 mg nightly. Restart naltrexone 50 mg daily as it not only helps prevent cravings of drug use but it can also help with food cravings.  Taking the Wellbutrin and naltrexone together can mimic Contrave. She is not seeing Rinaldo Cloud, LCSW in counseling anymore.  Patient states she feels better and also cannot continue due to financial reasons. Return in 3 months.   Donnal Moat, PA-C

## 2020-01-21 ENCOUNTER — Ambulatory Visit: Payer: Self-pay | Attending: Internal Medicine

## 2020-01-21 DIAGNOSIS — Z23 Encounter for immunization: Secondary | ICD-10-CM

## 2020-01-21 NOTE — Progress Notes (Signed)
   Covid-19 Vaccination Clinic  Name:  Larna Capelle    MRN: [id number] DOB: [birth year]  01/21/2020  Ms. Murakami was observed post Covid-19 immunization for 15 minutes without incident. She was provided with Vaccine Information Sheet and instruction to access the V-Safe system.   Ms. Kalp was instructed to call 911 with any severe reactions post vaccine: Marland Kitchen Difficulty breathing  . Swelling of face and throat  . A fast heartbeat  . A bad rash all over body  . Dizziness and weakness   Immunizations Administered    Name Date Dose VIS Date Route   Pfizer COVID-19 Vaccine 01/21/2020 11:36 AM 0.3 mL 10/21/2019 Intramuscular   Manufacturer: [employer], [employer]   Lot: [id number]   NDC: [address]-0

## 2020-02-14 ENCOUNTER — Ambulatory Visit: Payer: Self-pay | Attending: Internal Medicine

## 2020-02-14 DIAGNOSIS — Z23 Encounter for immunization: Secondary | ICD-10-CM

## 2020-02-14 NOTE — Progress Notes (Signed)
   Covid-19 Vaccination Clinic  Name:  Marzelle Rutten    MRN: [id number] DOB: 03-07-1986  02/14/2020  Ms. Hefley was observed post Covid-19 immunization for 15 minutes without incident. She was provided with Vaccine Information Sheet and instruction to access the V-Safe system.   Ms. Inscore was instructed to call 911 with any severe reactions post vaccine: Marland Kitchen Difficulty breathing  . Swelling of face and throat  . A fast heartbeat  . A bad rash all over body  . Dizziness and weakness   Immunizations Administered    Name Date Dose VIS Date Route   Pfizer COVID-19 Vaccine 02/14/2020 11:23 AM 0.3 mL 10/21/2019 Intramuscular   Manufacturer: [employer], [employer]   Lot: [id number]   NDC: [address]-2

## 2020-02-24 ENCOUNTER — Ambulatory Visit: Payer: Self-pay | Admitting: Physician Assistant

## 2020-11-20 ENCOUNTER — Other Ambulatory Visit: Payer: Self-pay | Admitting: Obstetrics and Gynecology

## 2020-11-20 DIAGNOSIS — Z363 Encounter for antenatal screening for malformations: Secondary | ICD-10-CM

## 2020-12-25 ENCOUNTER — Encounter: Payer: Self-pay | Admitting: *Deleted

## 2020-12-31 ENCOUNTER — Encounter: Payer: Self-pay | Admitting: *Deleted

## 2020-12-31 ENCOUNTER — Ambulatory Visit: Payer: Medicaid Other | Admitting: *Deleted

## 2020-12-31 ENCOUNTER — Ambulatory Visit: Payer: Medicaid Other | Attending: Obstetrics and Gynecology

## 2020-12-31 ENCOUNTER — Other Ambulatory Visit: Payer: Self-pay

## 2020-12-31 VITALS — BP 118/75 | HR 95 | Ht 63.0 in

## 2020-12-31 DIAGNOSIS — Z363 Encounter for antenatal screening for malformations: Secondary | ICD-10-CM | POA: Diagnosis not present

## 2020-12-31 DIAGNOSIS — Z3A19 19 weeks gestation of pregnancy: Secondary | ICD-10-CM

## 2020-12-31 DIAGNOSIS — O09522 Supervision of elderly multigravida, second trimester: Secondary | ICD-10-CM | POA: Diagnosis present

## 2021-02-23 IMAGING — US US MFM OB DETAIL+14 WK
2 series · 13 of 28 positions shown · non-contrast
Comparison: none

[Series 1: us mfm ob detail+14 wk · 1 of 7 slices shown (1 of 2)]
[im 7/7]
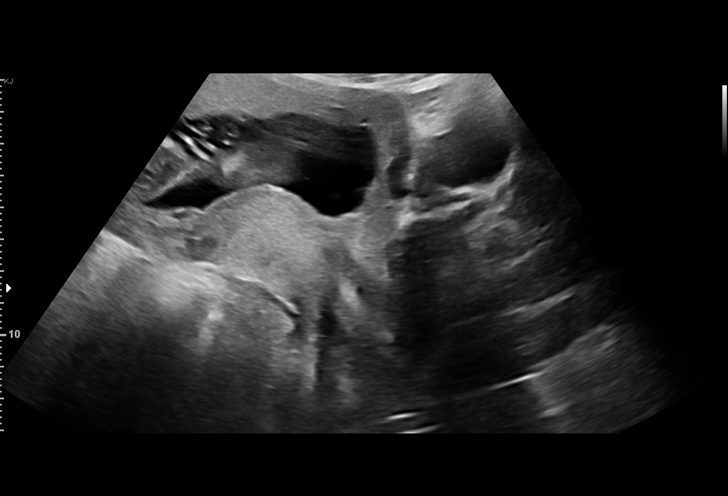

[Series 3: us mfm ob detail+14 wk · 12 of 123 slices shown (2 of 2)]
[im 5/123]
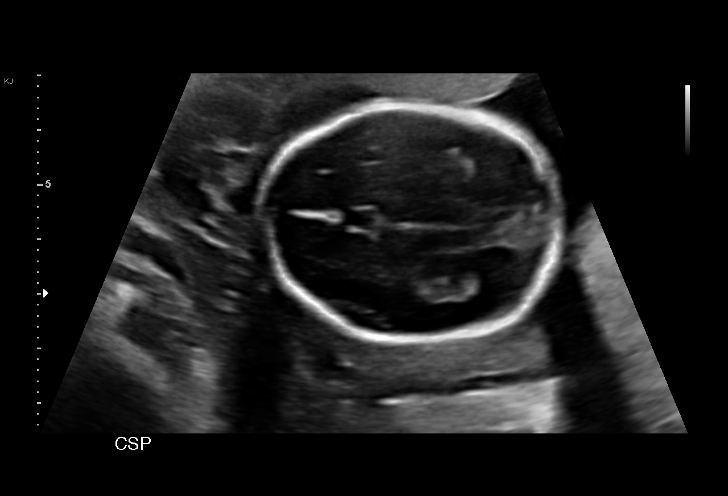
[im 15/123]
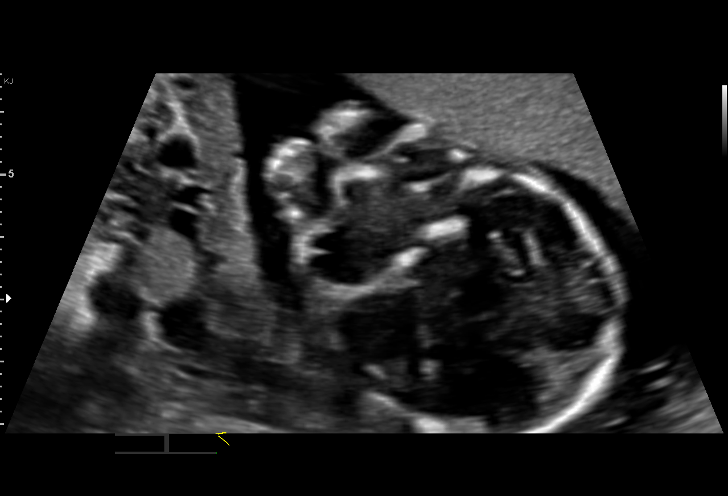
[im 25/123]
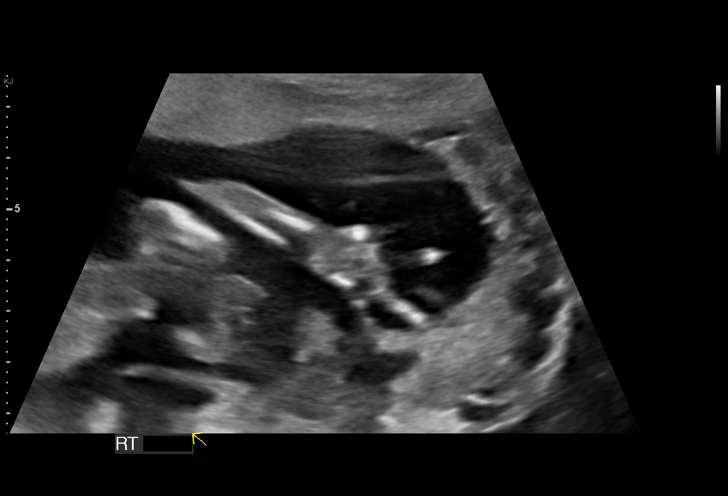
[im 35/123]
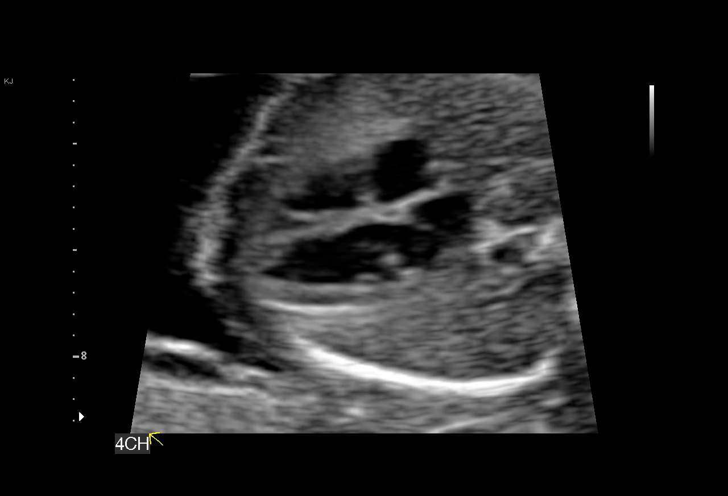
[im 44/123]
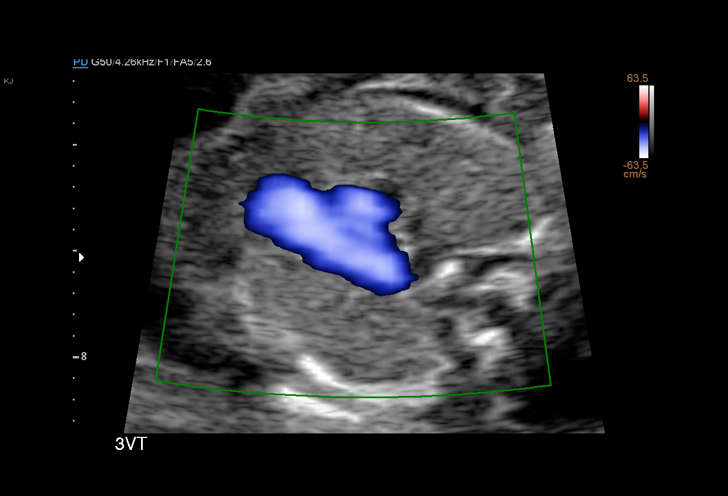
[im 59/123]
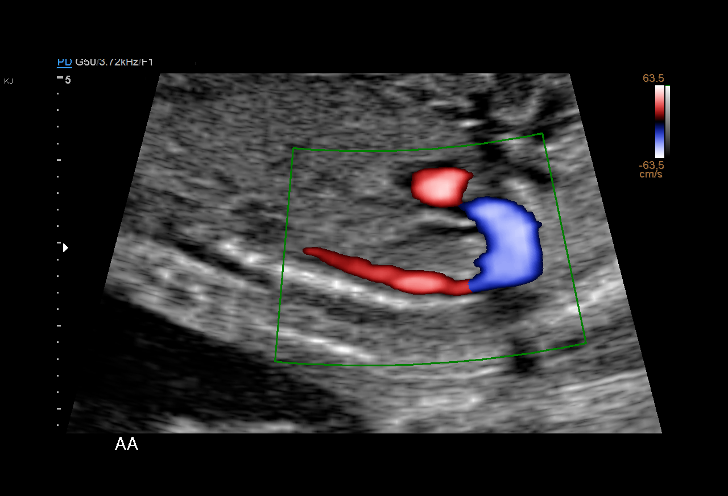
[im 69/123]
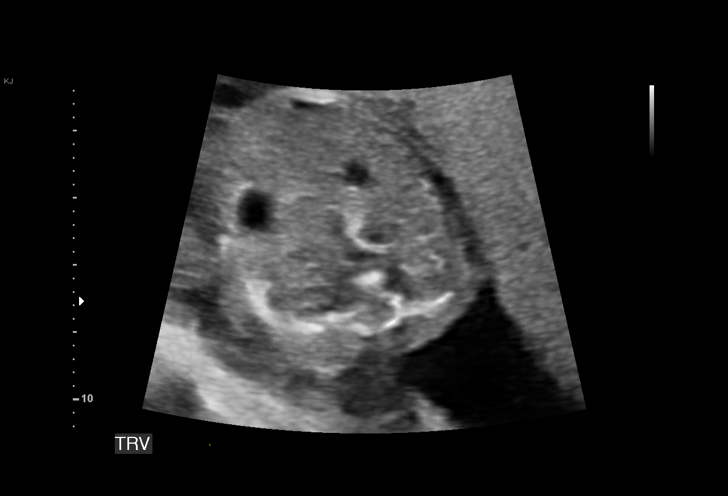
[im 79/123]
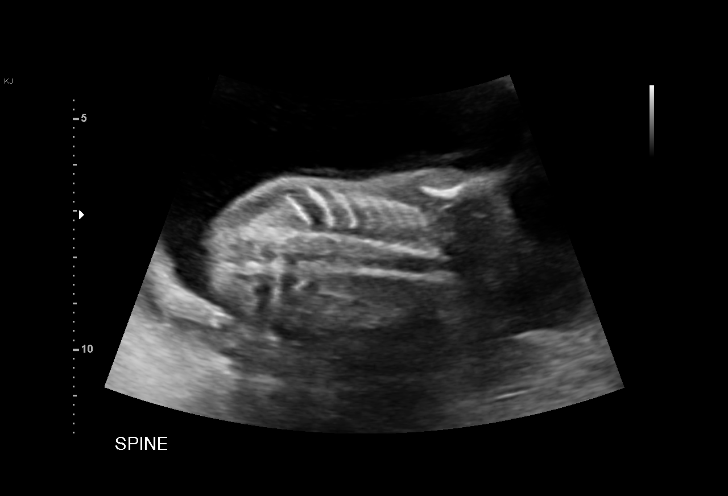
[im 88/123]
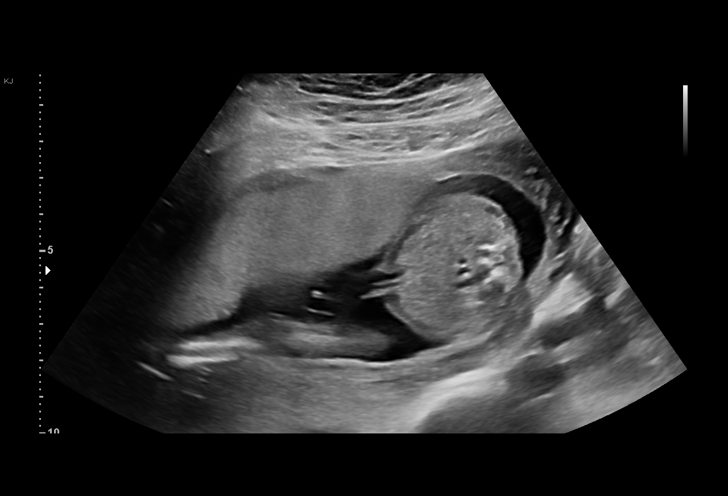
[im 98/123]
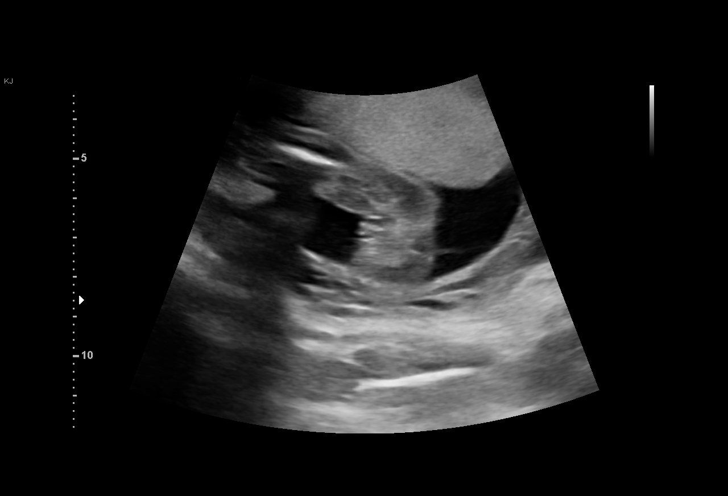
[im 108/123]
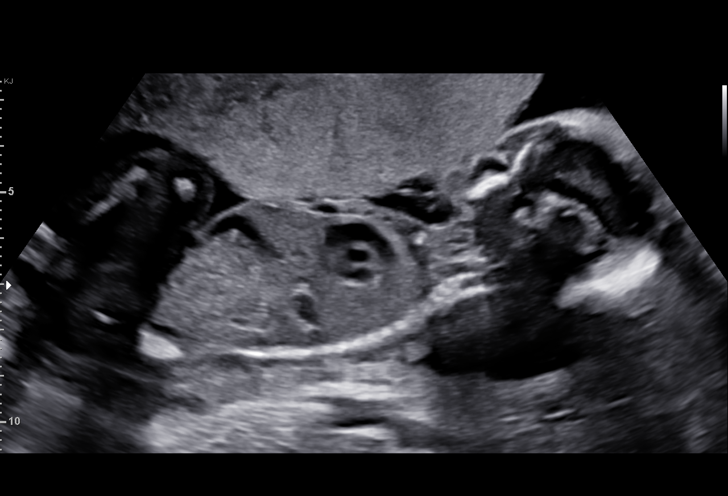
[im 118/123]
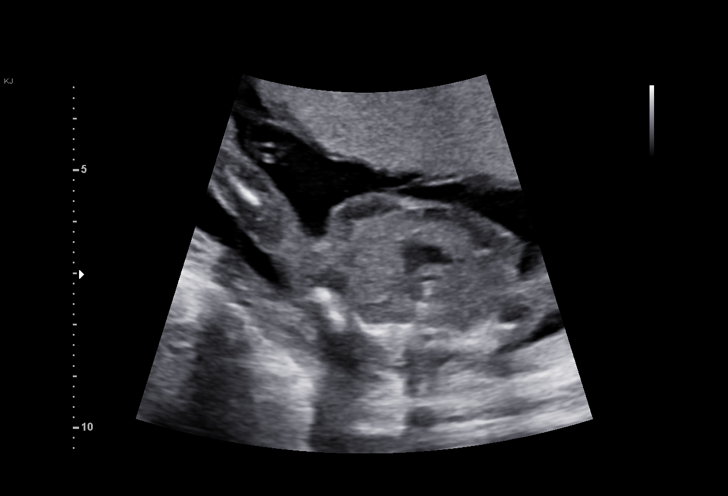

[13 of 28 positions shown; findings below may reference images not displayed]

OBGYN
                                                            [REDACTED]
                   [facility]

Indications

 Advanced maternal age multigravida 33+,
 second trimester
 Normal NIPS
 19 weeks gestation of pregnancy
Fetal Evaluation

 Num Of Fetuses:         1
 Fetal Heart Rate(bpm):  143
 Cardiac Activity:       Observed
 Presentation:           Cephalic
 Placenta:               Anterior
 P. Cord Insertion:      Visualized, central

 Amniotic Fluid
 AFI FV:      Within normal limits
Biometry

 BPD:      42.1  mm     G. Age:  18w 5d         39  %    CI:        70.31   %    70 - 86
                                                         FL/HC:      17.7   %    16.1 -
 HC:      160.1  mm     G. Age:  18w 6d         34  %    HC/AC:      1.26        1.09 -
 AC:      127.3  mm     G. Age:  18w 2d         24  %    FL/BPD:     67.5   %
 FL:       28.4  mm     G. Age:  18w 5d         33  %    FL/AC:      22.3   %    20 - 24
 HUM:      28.4  mm     G. Age:  19w 1d         55  %
 CER:        20  mm     G. Age:  19w 2d         50  %
 NFT:       4.0  mm
 LV:        5.2  mm
 CM:        3.6  mm

 Est. FW:     245  gm      0 lb 9 oz     21  %
OB History

 Gravidity:    2         Term:   1        Prem:   0        SAB:   0
 TOP:          0       Ectopic:  0        Living: 1
Gestational Age

 LMP:           19w 0d        Date:  08/20/20                 EDD:   05/27/21
 U/S Today:     18w 5d                                        EDD:   05/29/21
 Best:          19w 0d     Det. By:  LMP  (08/20/20)          EDD:   05/27/21
Anatomy

 Cranium:               Appears normal         LVOT:                   Appears normal
 Cavum:                 Appears normal         Aortic Arch:            Appears normal
 Ventricles:            Appears normal         Ductal Arch:            Appears normal
 Choroid Plexus:        Appears normal         Diaphragm:              Appears normal
 Cerebellum:            Appears normal         Stomach:                Appears normal, left
                                                                       sided
 Posterior Fossa:       Appears normal         Abdomen:                Appears normal
 Nuchal Fold:           Appears normal         Abdominal Wall:         Appears nml (cord
                                                                       insert, abd wall)
 Face:                  Appears normal         Cord Vessels:           Appears normal (3
                        (orbits and profile)                           vessel cord)
 Lips:                  Appears normal         Kidneys:                Appear normal
 Palate:                Appears normal         Bladder:                Appears normal
 Thoracic:              Appears normal         Spine:                  Appears normal
 Heart:                 Appears normal         Upper Extremities:      Appears normal
                        (4CH, axis, and
                        situs)
 RVOT:                  Appears normal         Lower Extremities:      Appears normal

 Other:  SVC IVC appears normal. 3VV/T appears normal. Heels and 5th digit
         visualized. Nasal bone visualized. Normal genaitalia. Parents do not
         wish to know sex of fetus.
Cervix Uterus Adnexa

 Cervix
 Length:            4.2  cm.
 Normal appearance by transabdominal scan.

 Adnexa
 No abnormality visualized.
Comments

 This patient was seen for a detailed fetal anatomy scan due
 to advanced maternal age.
 She denies any significant past medical history and denies
 any problems in her current pregnancy.
 She had a cell free DNA test earlier in her pregnancy which
 indicated a low risk for trisomy 21, 18, and 13.  The patient
 did not want the fetal gender revealed today.
 She was informed that the fetal growth and amniotic fluid
 level were appropriate for her gestational age.
 There were no obvious fetal anomalies noted on today's
 ultrasound exam.
 The patient was informed that anomalies may be missed due
 to technical limitations. If the fetus is in a suboptimal position
 or maternal habitus is increased, visualization of the fetus in
 the maternal uterus may be impaired.
 The increased risk of fetal aneuploidy due to advanced
 maternal age was discussed. Due to advanced maternal age,
 the patient was offered and declined an amniocentesis today
 for definitive diagnosis of fetal aneuploidy.
 Follow-up as indicated.

## 2021-04-16 ENCOUNTER — Other Ambulatory Visit: Payer: Self-pay

## 2021-04-16 ENCOUNTER — Inpatient Hospital Stay (HOSPITAL_COMMUNITY)
Admission: AD | Admit: 2021-04-16 | Discharge: 2021-04-16 | Disposition: A | Discharge status: Home / Self Care | Payer: Medicaid Other | Attending: Obstetrics and Gynecology | Admitting: Obstetrics and Gynecology

## 2021-04-16 ENCOUNTER — Encounter (HOSPITAL_COMMUNITY): Payer: Self-pay | Admitting: Obstetrics and Gynecology

## 2021-04-16 DIAGNOSIS — O10919 Unspecified pre-existing hypertension complicating pregnancy, unspecified trimester: Secondary | ICD-10-CM

## 2021-04-16 DIAGNOSIS — O09523 Supervision of elderly multigravida, third trimester: Secondary | ICD-10-CM | POA: Insufficient documentation

## 2021-04-16 DIAGNOSIS — O10013 Pre-existing essential hypertension complicating pregnancy, third trimester: Secondary | ICD-10-CM

## 2021-04-16 DIAGNOSIS — O10913 Unspecified pre-existing hypertension complicating pregnancy, third trimester: Secondary | ICD-10-CM | POA: Diagnosis not present

## 2021-04-16 DIAGNOSIS — Z87891 Personal history of nicotine dependence: Secondary | ICD-10-CM | POA: Diagnosis not present

## 2021-04-16 DIAGNOSIS — Z882 Allergy status to sulfonamides status: Secondary | ICD-10-CM | POA: Insufficient documentation

## 2021-04-16 DIAGNOSIS — Z7982 Long term (current) use of aspirin: Secondary | ICD-10-CM | POA: Diagnosis not present

## 2021-04-16 DIAGNOSIS — Z3A34 34 weeks gestation of pregnancy: Secondary | ICD-10-CM | POA: Diagnosis not present

## 2021-04-16 DIAGNOSIS — Z3689 Encounter for other specified antenatal screening: Secondary | ICD-10-CM

## 2021-04-16 DIAGNOSIS — R109 Unspecified abdominal pain: Secondary | ICD-10-CM | POA: Diagnosis not present

## 2021-04-16 DIAGNOSIS — O26893 Other specified pregnancy related conditions, third trimester: Secondary | ICD-10-CM | POA: Insufficient documentation

## 2021-04-16 LAB — COMPREHENSIVE METABOLIC PANEL
ALT: 14 U/L (ref 0–44)
AST: 21 U/L (ref 15–41)
Albumin: 2.6 g/dL — ABNORMAL LOW (ref 3.5–5.0)
Alkaline Phosphatase: 95 U/L (ref 38–126)
Anion gap: 9 (ref 5–15)
BUN: 6 mg/dL (ref 6–20)
CO2: 25 mmol/L (ref 22–32)
Calcium: 8.5 mg/dL — ABNORMAL LOW (ref 8.9–10.3)
Chloride: 103 mmol/L (ref 98–111)
Creatinine, Ser: 0.73 mg/dL (ref 0.44–1.00)
GFR, Estimated: >60 mL/min (ref >60)
Glucose, Bld: 93 mg/dL (ref 70–99)
Potassium: 4.1 mmol/L (ref 3.5–5.1)
Sodium: 137 mmol/L (ref 135–145)
Total Bilirubin: 0.5 mg/dL (ref 0.3–1.2)
Total Protein: 5.9 g/dL — ABNORMAL LOW (ref 6.5–8.1)

## 2021-04-16 LAB — CBC
HCT: 35 % — ABNORMAL LOW (ref 36.0–46.0)
Hemoglobin: 11.9 g/dL — ABNORMAL LOW (ref 12.0–15.0)
MCH: 29.5 pg (ref 26.0–34.0)
MCHC: 34 g/dL (ref 30.0–36.0)
MCV: 86.8 fL (ref 80.0–100.0)
Platelets: 231 10*3/uL (ref 150–400)
RBC: 4.03 MIL/uL (ref 3.87–5.11)
RDW: 12.7 % (ref 11.5–15.5)
WBC: 13.6 10*3/uL — ABNORMAL HIGH (ref 4.0–10.5)
nRBC: 0 % (ref 0.0–0.2)

## 2021-04-16 LAB — URINALYSIS, ROUTINE W REFLEX MICROSCOPIC
Bilirubin Urine: NEGATIVE
Glucose, UA: NEGATIVE mg/dL
Hgb urine dipstick: NEGATIVE
Ketones, ur: 20 mg/dL — AB
Leukocytes,Ua: NEGATIVE
Nitrite: NEGATIVE
Protein, ur: NEGATIVE mg/dL
Specific Gravity, Urine: 1.013 (ref 1.005–1.030)
pH: 6 (ref 5.0–8.0)

## 2021-04-16 LAB — PROTEIN / CREATININE RATIO, URINE
Creatinine, Urine: 159.36 mg/dL
Protein Creatinine Ratio: 0.18 mg/mg{Cre} — ABNORMAL HIGH (ref 0.00–0.15)
Total Protein, Urine: 29 mg/dL

## 2021-04-16 NOTE — MAU Note (Signed)
Hx of high blood pressure (chronic).  Today was 140/98(1530).  Has been seeing floaters, yesterday or day before.  Pain in ribs on rt side.  +protein in urine. Denies HA, reports increase in swelling in hands and feet.

## 2021-04-16 NOTE — Discharge Instructions (Signed)
Preeclampsia and Eclampsia Preeclampsia is a serious condition that may develop during pregnancy. This condition involves high blood pressure during pregnancy and causes symptoms such as headaches, vision changes, and increased swelling in the legs, hands, and face. Preeclampsia occurs after 20 weeks of pregnancy. Eclampsia is a seizure that happens from worsening preeclampsia. Diagnosing and managing preeclampsia early is important. If not treated early, it can cause serious problems for mother and baby. There is no cure for this condition. However, during pregnancy, delivering the baby may be the best treatment for preeclampsia or eclampsia. For most women, symptoms of preeclampsia and eclampsia go away after giving birth. In rare cases, a woman may develop preeclampsia or eclampsia after giving birth. This usually occurs within 48 hours after childbirth but may occur up to 6 weeks after giving birth. What are the causes? The cause of this condition is not known. What increases the risk? The following factors make you more likely to develop preeclampsia:  Being pregnant for the first time or being pregnant with multiples.  Having had preeclampsia or a condition called hemolysis, elevated liver enzymes, and low platelet count (HELLP)syndrome during a past pregnancy.  Having a family history of preeclampsia.  Being older than age 35.  Being obese.  Becoming pregnant through fertility treatments. Conditions that reduce blood flow or oxygen to your placenta and baby may also increase your risk. These include:  High blood pressure before, during, or immediately following pregnancy.  Kidney disease.  Diabetes.  Blood clotting disorders.  Autoimmune diseases, such as lupus.  Sleep apnea. What are the signs or symptoms? Common symptoms of this condition include:  A severe, throbbing headache that does not go away.  Vision problems, such as blurred or double vision and light  sensitivity.  Pain in the stomach, especially the right upper region.  Pain in the shoulder. Other symptoms that may develop as the condition gets worse include:  Sudden weight gain because of fluid buildup in the body. This causes swelling of the face, hands, legs, and feet.  Severe nausea and vomiting.  Urinating less than usual.  Shortness of breath.  Seizures. How is this diagnosed? Your health care provider will ask you about symptoms and check for signs of preeclampsia during your prenatal visits. You will also have routine tests, including:  Checking your blood pressure.  Urine tests to check for protein.  Blood tests to assess your organ function.  Monitoring your baby's heart rate.  Ultrasounds to check fetal growth.   How is this treated? You and your health care provider will determine the treatment that is best for you. Treatment may include:  Frequent prenatal visits to check for preeclampsia.  Medicine to lower your blood pressure.  Medicine to prevent seizures.  Low-dose aspirin during your pregnancy.  Staying in the hospital, in severe cases. You will be given medicines to control your blood pressure and the amount of fluids in your body.  Delivering your baby. Work with your health care provider to manage any chronic health conditions, such as diabetes or kidney problems. Also, work with your health care provider to manage weight gain during pregnancy. Follow these instructions at home: Eating and drinking  Drink enough fluid to keep your urine pale yellow.  Avoid caffeine. Caffeine may increase blood pressure and heart rate and lead to dehydration.  Reduce the amount of salt that you eat. Lifestyle  Do not use any products that contain nicotine or tobacco. These products include cigarettes, chewing tobacco, and   vaping devices, such as e-cigarettes. If you need help quitting, ask your health care provider. °· Do not use alcohol or drugs. °· Avoid  stress as much as possible. °· Rest and get plenty of sleep. °General instructions °· Take over-the-counter and prescription medicines only as told by your health care provider. °· When lying down, lie on your left side. This keeps pressure off your major blood vessels. °· When sitting or lying down, raise (elevate) your feet. Try putting pillows underneath your lower legs. °· Exercise regularly. Ask your health care provider what kinds of exercise are best for you. °· Check your blood pressure as often as recommended by your health care provider. °· Keep all prenatal and follow-up visits. This is important.   °Contact a health care provider if: °· You have symptoms that may need treatment or closer monitoring. These include: °? Headaches. °? Stomach pain or nausea and vomiting. °? Shoulder pain. °? Vision problems, such as spots in front of your eyes or blurry vision. °? Sudden weight gain or increased swelling in your face, hands, legs, and feet. °? Increased anxiety or feeling of impending doom. °? Signs or symptoms of labor. °Get help right away if: °· You have any of the following symptoms: °? A seizure. °? Shortness of breath or trouble breathing. °? Trouble speaking or slurred speech. °? Fainting. °? Chest pain. °These symptoms may represent a serious problem that is an emergency. Do not wait to see if the symptoms will go away. Get medical help right away. Call your local emergency services (911 in the [place] Do not drive yourself to the hospital. °Summary °· Preeclampsia is a serious condition that may develop during pregnancy. °· Diagnosing and treating preeclampsia early is very important. °· Keep all prenatal and follow-up visits. This is important. °· Get help right away if you have a seizure, shortness of breath or trouble breathing, trouble speaking or slurred speech, chest pain, or fainting. °This information is not intended to replace advice given to you by your health care provider. Make sure you  discuss any questions you have with your health care provider. °Document Revised: 07/19/2020 Document Reviewed: 07/19/2020 °Elsevier Patient Education © 2021 [employer] ° ° ° ° ° ° ° °Fetal Movement Counts °Patient Name: ________________________________________________ Patient Due Date: ____________________ ° °What is a fetal movement count? °A fetal movement count is the number of times that you feel your baby move during a certain amount of time. This may also be called a fetal kick count. A fetal movement count is recommended for every pregnant woman. You may be asked to start counting fetal movements as early as week 28 of your pregnancy. °Pay attention to when your baby is most active. You may notice your baby's sleep and wake cycles. You may also notice things that make your baby move more. You should do a fetal movement count: °· When your baby is normally most active. °· At the same time each day. °A good time to count movements is while you are resting, after having something to eat and drink. °How do I count fetal movements? °1. Find a quiet, comfortable area. Sit, or lie down on your side. °2. Write down the date, the start time and stop time, and the number of movements that you felt between those two times. Take this information with you to your health care visits. °3. Write down your start time when you feel the first movement. °4. Count kicks, flutters, swishes, rolls, and jabs.   You should feel at least 10 movements. 5. You may stop counting after you have felt 10 movements, or if you have been counting for 2 hours. Write down the stop time. 6. If you do not feel 10 movements in 2 hours, contact your health care provider for further instructions. Your health care provider may want to do additional tests to assess your baby's well-being. Contact a health care provider if:  You feel fewer than 10 movements in 2 hours.  Your baby is not moving like he or she usually does. Date: ____________  Start time: ____________ Stop time: ____________ Movements: ____________ Date: ____________ Start time: ____________ Stop time: ____________ Movements: ____________ Date: ____________ Start time: ____________ Stop time: ____________ Movements: ____________ Date: ____________ Start time: ____________ Stop time: ____________ Movements: ____________ Date: ____________ Start time: ____________ Stop time: ____________ Movements: ____________ Date: ____________ Start time: ____________ Stop time: ____________ Movements: ____________ Date: ____________ Start time: ____________ Stop time: ____________ Movements: ____________ Date: ____________ Start time: ____________ Stop time: ____________ Movements: ____________ Date: ____________ Start time: ____________ Stop time: ____________ Movements: ____________ This information is not intended to replace advice given to you by your health care provider. Make sure you discuss any questions you have with your health care provider. Document Revised: 06/16/2019 Document Reviewed: 06/16/2019 Else[employer] Patient Education  2021 [employer].

## 2021-04-16 NOTE — MAU Provider Note (Signed)
History     CSN: [id number]  Arrival date and time: 04/16/21 1641   Event Date/Time   First Provider Initiated Contact with Patient 04/16/21 1752      Chief Complaint  Patient presents with  . Hypertension  . Abdominal Pain  . visual changes   Kelly Moses is a 35 y.o. G2P1001 at 5425w1d who presents to MAU for preeclampsia evaluation after she was seen today in the office for a regular OB visit and was found to have elevated BP. Patient reports she does have a history of cHTN, but does not take any medications for this. Patient reports some mild RUQ pain today and reports having some floaters in her vision a few days ago, but denies floaters at this time.  Pt denies HA, blurry vision/seeing spots, N/V, epigastric pain, swelling in face and hands, sudden weight gain. Pt denies chest pain and SOB.  Pt denies constipation, diarrhea, or urinary problems. Pt denies fever, chills, fatigue, sweating or changes in appetite. Pt denies dizziness, light-headedness, weakness.  Pt denies VB, ctx, LOF and reports good FM.  Current pregnancy problems? cHTN Allergies? sulfa   OB History    Gravida  2   Para  1   Term  1   Preterm      AB      Living  1     SAB      IAB      Ectopic      Multiple      Live Births  1           Past Medical History:  Diagnosis Date  . Hypertension   . Polysubstance abuse [facility](HCC)     Past Surgical History:  Procedure Laterality Date  . WISDOM TOOTH EXTRACTION      Family History  Problem Relation Age of Onset  . Diabetes Mother   . Hypertension Father   . Obesity Father   . Cancer Maternal Grandmother   . Cancer Maternal Grandfather     Social History   Tobacco Use  . Smoking status: Former Games developermoker  . Smokeless tobacco: Never Used  . Tobacco comment: 1 pod/day  Vaping Use  . Vaping Use: Former  . Substances: Nicotine  Substance Use Topics  . Alcohol use: Yes    Alcohol/week: 0.0 - 1.0 standard drinks     Comment: rare   . Drug use: Not Currently    Types: Cocaine, Heroin, Benzodiazepines, Methamphetamines    Comment: Rehab at [facility] in 2018.  Clean since then.    Allergies:  Allergies  Allergen Reactions  . Sulfa Antibiotics Rash    Medications Prior to Admission  Medication Sig Dispense Refill Last Dose  . Prenatal Vit-Fe Fumarate-FA (PRENATAL MULTIVITAMIN) TABS tablet Take 1 tablet by mouth daily at 12 noon.   04/16/2021 at Unknown time  . amphetamine-dextroamphetamine (ADDERALL) 20 MG tablet Take 20 mg by mouth 3 (three) times daily. (Patient not taking: Reported on 12/31/2020)     . aspirin EC 81 MG tablet Take 81 mg by mouth daily. Swallow whole.     Marland Kitchen. buPROPion (WELLBUTRIN XL) 300 MG 24 hr tablet Take 1 tablet (300 mg total) by mouth daily. (Patient not taking: Reported on 12/31/2020) 90 tablet 0   . doxylamine, Sleep, (UNISOM) 25 MG tablet Take 25 mg by mouth at bedtime as needed.     . hydrOXYzine (ATARAX/VISTARIL) 25 MG tablet Take 25 mg by mouth 3 (three) times daily as needed. (Patient  not taking: Reported on 12/31/2020)     . naltrexone (DEPADE) 50 MG tablet Take 1 tablet (50 mg total) by mouth daily. (Patient not taking: Reported on 12/31/2020) 90 tablet 0   . QUEtiapine (SEROQUEL) 100 MG tablet Take 1 tablet (100 mg total) by mouth at bedtime. (Patient not taking: Reported on 12/31/2020) 90 tablet 0     Review of Systems  Constitutional: Negative for chills, diaphoresis, fatigue and fever.  Eyes: Negative for visual disturbance.  Respiratory: Negative for shortness of breath.   Cardiovascular: Negative for chest pain.  Gastrointestinal: Positive for abdominal pain (RUQ pain). Negative for constipation, diarrhea, nausea and vomiting.  Genitourinary: Negative for dysuria, flank pain, frequency, pelvic pain, urgency, vaginal bleeding and vaginal discharge.  Neurological: Negative for dizziness, weakness, light-headedness and headaches.   Physical Exam   Blood pressure  (!) 150/99, pulse 84, temperature 98.1 F (36.7 C), temperature source Oral, resp. rate 18, height 5\' 3"  (1.6 m), weight 78.2 kg, last menstrual period 08/20/2020, SpO2 99 %.  Patient Vitals for the past 24 hrs:  BP Temp Temp src Pulse Resp SpO2 Height Weight  04/16/21 2031 (!) 150/99 -- -- 84 -- -- -- --  04/16/21 2016 (!) 154/97 -- -- 85 -- -- -- --  04/16/21 2001 (!) 146/103 -- -- 90 -- -- -- --  04/16/21 1946 (!) 144/93 -- -- 91 -- -- -- --  04/16/21 1931 (!) 150/102 -- -- 84 -- -- -- --  04/16/21 1916 (!) 152/94 -- -- 71 -- -- -- --  04/16/21 1901 (!) 144/102 -- -- 64 -- -- -- --  04/16/21 1846 (!) 152/102 -- -- 88 -- -- -- --  04/16/21 1831 (!) 145/98 -- -- 88 -- -- -- --  04/16/21 1815 (!) 145/96 -- -- 84 -- 99 % -- --  04/16/21 1812 (!) 146/97 -- -- 83 -- -- -- --  04/16/21 1734 (!) 153/95 98.1 F (36.7 C) Oral 88 18 99 % 5\' 3"  (1.6 m) 78.2 kg   Physical Exam Vitals and nursing note reviewed.  Constitutional:      General: She is not in acute distress.    Appearance: Normal appearance. She is not ill-appearing, toxic-appearing or diaphoretic.  HENT:     Head: Normocephalic and atraumatic.  Pulmonary:     Effort: Pulmonary effort is normal.  Abdominal:     General: There is no distension.     Palpations: Abdomen is soft. There is no mass.     Tenderness: There is no abdominal tenderness.     Hernia: No hernia is present.  Neurological:     Mental Status: She is alert and oriented to person, place, and time.  Psychiatric:        Mood and Affect: Mood normal.        Thought Content: Thought content normal.        Judgment: Judgment normal.    Results for orders placed or performed during the hospital encounter of 04/16/21 (from the past 24 hour(s))  Urinalysis, Routine w reflex microscopic Urine, Clean Catch     Status: Abnormal   Collection Time: 04/16/21  5:57 PM  Result Value Ref Range   Color, Urine YELLOW YELLOW   APPearance HAZY (A) CLEAR   Specific Gravity,  Urine 1.013 1.005 - 1.030   pH 6.0 5.0 - 8.0   Glucose, UA NEGATIVE NEGATIVE mg/dL   Hgb urine dipstick NEGATIVE NEGATIVE   Bilirubin Urine NEGATIVE NEGATIVE   Ketones, ur  20 (A) NEGATIVE mg/dL   Protein, ur NEGATIVE NEGATIVE mg/dL   Nitrite NEGATIVE NEGATIVE   Leukocytes,Ua NEGATIVE NEGATIVE  Protein / creatinine ratio, urine     Status: Abnormal   Collection Time: 04/16/21  5:57 PM  Result Value Ref Range   Creatinine, Urine 159.36 mg/dL   Total Protein, Urine 29 mg/dL   Protein Creatinine Ratio 0.18 (H) 0.00 - 0.15 mg/mg[Cre]  CBC     Status: Abnormal   Collection Time: 04/16/21  6:03 PM  Result Value Ref Range   WBC 13.6 (H) 4.0 - 10.5 K/uL   RBC 4.03 3.87 - 5.11 MIL/uL   Hemoglobin 11.9 (L) 12.0 - 15.0 g/dL   HCT 70.3 (L) 50.0 - 93.8 %   MCV 86.8 80.0 - 100.0 fL   MCH 29.5 26.0 - 34.0 pg   MCHC 34.0 30.0 - 36.0 g/dL   RDW 18.2 99.3 - 71.6 %   Platelets 231 150 - 400 K/uL   nRBC 0.0 0.0 - 0.2 %  Comprehensive metabolic panel     Status: Abnormal   Collection Time: 04/16/21  6:03 PM  Result Value Ref Range   Sodium 137 135 - 145 mmol/L   Potassium 4.1 3.5 - 5.1 mmol/L   Chloride 103 98 - 111 mmol/L   CO2 25 22 - 32 mmol/L   Glucose, Bld 93 70 - 99 mg/dL   BUN 6 6 - 20 mg/dL   Creatinine, Ser 9.67 0.44 - 1.00 mg/dL   Calcium 8.5 (L) 8.9 - 10.3 mg/dL   Total Protein 5.9 (L) 6.5 - 8.1 g/dL   Albumin 2.6 (L) 3.5 - 5.0 g/dL   AST 21 15 - 41 U/L   ALT 14 0 - 44 U/L   Alkaline Phosphatase 95 38 - 126 U/L   Total Bilirubin 0.5 0.3 - 1.2 mg/dL   GFR, Estimated >89 >38 mL/min   Anion gap 9 5 - 15  ABO/Rh     Status: None   Collection Time: 04/16/21  6:10 PM  Result Value Ref Range   ABO/RH(D)      A POS Performed at [facility], [address] [place], [place] [address]     No results found.  MAU Course  Procedures  MDM -preeclampsia evaluation without severe range BP in MAU on admission -symptoms include: mild RUQ pain -UA: hazy/20ketones, otherwise  WNL -CBC: H/H 11.9/35.0, platelets 231 -CMP: serum creatinine 0.73, AST/ALT 21/14 -PCr: 0.18 -EFM: reactive       -baseline: 130       -variability: moderate       -accels: present, 15x15       -decels: absent       -TOCO: quiet -consulted with Dr. Donavan Foil, pr Dr. Donavan Foil, pt Ok to be discharged home with close follow-up in office -called and spoke with Dr. Tenny Craw who recommends patient call office tomorrow to schedule both NST and BP check on Thursday or Friday of this week -pt discharged to home in stable condition  Orders Placed This Encounter  Procedures  . Urinalysis, Routine w reflex microscopic Urine, Clean Catch    Standing Status:   Standing    Number of Occurrences:   1  . CBC    Standing Status:   Standing    Number of Occurrences:   1  . Comprehensive metabolic panel    Standing Status:   Standing    Number of Occurrences:   1  . Protein / creatinine ratio, urine  Standing Status:   Standing    Number of Occurrences:   1  . ABO/Rh    Standing Status:   Standing    Number of Occurrences:   1  . Discharge patient    Order Specific Question:   Discharge disposition    Answer:   01-Home or Self Care [1]    Order Specific Question:   Discharge patient date    Answer:   04/16/2021   No orders of the defined types were placed in this encounter.  Assessment and Plan   1. Chronic hypertension affecting pregnancy   2. [redacted] weeks gestation of pregnancy   3. NST (non-stress test) reactive    Allergies as of 04/16/2021      Reactions   Sulfa Antibiotics Rash      Medication List    TAKE these medications   amphetamine-dextroamphetamine 20 MG tablet Commonly known as: ADDERALL Take 20 mg by mouth 3 (three) times daily.   aspirin EC 81 MG tablet Take 81 mg by mouth daily. Swallow whole.   buPROPion 300 MG 24 hr tablet Commonly known as: WELLBUTRIN XL Take 1 tablet (300 mg total) by mouth daily.   doxylamine (Sleep) 25 MG tablet Commonly known as: UNISOM Take 25 mg  by mouth at bedtime as needed.   hydrOXYzine 25 MG tablet Commonly known as: ATARAX/VISTARIL Take 25 mg by mouth 3 (three) times daily as needed.   naltrexone 50 MG tablet Commonly known as: DEPADE Take 1 tablet (50 mg total) by mouth daily.   prenatal multivitamin Tabs tablet Take 1 tablet by mouth daily at 12 noon.   QUEtiapine 100 MG tablet Commonly known as: SEROquel Take 1 tablet (100 mg total) by mouth at bedtime.      -pt advised to call office tomorrow for BP check and NST on Thursday or Friday per Dr. Tenny Craw, pt verbalizes understanding and agrees with plan -Reviewed warning blood pressure values (systolic = / > 140 and/or diastolic =/> 90). Explained that, if blood pressure is elevated, she should sit down, rest, and eat/drink something. If still elevated 15 minutes later, and she is greater than 20 weeks, she should call clinic or come to MAU. She should come to MAU if she has elevated pressures and any of the following:  -headache not relieved with tylenol, rest, hydration -blurry vision, floating spots in her vision -sudden full-body edema or facial edema -RUQ pain that is constant. -chest pain or shortness of breath -new onset or sudden worsening of nausea and vomiting These symptoms may indicate that her blood pressure is worsening and she may be developing gestational hypertension or pre-eclampsia, which is an emergency.  -discussed severe range pressures of 160/110 or higher need to be evaluated in MAU immediately with or without symptoms -discussed monitoring of fetal movement -return MAU precautions given -pt discharged to home in stable condition  Joni Reining E Rockelle Heuerman 04/16/2021, 8:58 PM

## 2021-04-17 LAB — ABO/RH: ABO/RH(D): A POS

## 2021-05-03 ENCOUNTER — Encounter (HOSPITAL_COMMUNITY): Payer: Self-pay | Admitting: *Deleted

## 2021-05-03 ENCOUNTER — Telehealth (HOSPITAL_COMMUNITY): Payer: Self-pay | Admitting: *Deleted

## 2021-05-03 NOTE — Telephone Encounter (Signed)
Preadmission screen  

## 2021-05-06 ENCOUNTER — Other Ambulatory Visit: Payer: Self-pay | Admitting: Obstetrics and Gynecology

## 2021-05-06 ENCOUNTER — Other Ambulatory Visit (HOSPITAL_COMMUNITY)
Admission: RE | Admit: 2021-05-06 | Discharge: 2021-05-06 | Disposition: A | Discharge status: Home / Self Care | Payer: Medicaid Other | Source: Ambulatory Visit | Attending: Obstetrics and Gynecology | Admitting: Obstetrics and Gynecology

## 2021-05-06 DIAGNOSIS — Z20822 Contact with and (suspected) exposure to covid-19: Secondary | ICD-10-CM | POA: Insufficient documentation

## 2021-05-06 DIAGNOSIS — Z01812 Encounter for preprocedural laboratory examination: Secondary | ICD-10-CM | POA: Insufficient documentation

## 2021-05-06 LAB — SARS CORONAVIRUS 2 (TAT 6-24 HRS): SARS Coronavirus 2: NEGATIVE

## 2021-05-07 ENCOUNTER — Inpatient Hospital Stay (HOSPITAL_COMMUNITY): Payer: Medicaid Other | Admitting: Anesthesiology

## 2021-05-07 ENCOUNTER — Other Ambulatory Visit: Payer: Self-pay

## 2021-05-07 ENCOUNTER — Observation Stay (HOSPITAL_COMMUNITY): Payer: Medicaid Other | Attending: Obstetrics and Gynecology

## 2021-05-07 ENCOUNTER — Encounter (HOSPITAL_COMMUNITY): Payer: Self-pay | Admitting: Obstetrics and Gynecology

## 2021-05-07 ENCOUNTER — Observation Stay (HOSPITAL_COMMUNITY)
Admission: AD | Admit: 2021-05-07 | Payer: Medicaid Other | Source: Home / Self Care | Admitting: Obstetrics and Gynecology

## 2021-05-07 ENCOUNTER — Encounter (HOSPITAL_COMMUNITY)
Admission: RE | Disposition: A | Discharge status: Home / Self Care | Payer: Self-pay | Source: Home / Self Care | Attending: Obstetrics and Gynecology

## 2021-05-07 ENCOUNTER — Inpatient Hospital Stay (HOSPITAL_COMMUNITY)
Admission: RE | Admit: 2021-05-07 | Discharge: 2021-05-09 | DRG: 787 | Disposition: A | Discharge status: Home / Self Care | Payer: Medicaid Other | Attending: Obstetrics and Gynecology | Admitting: Obstetrics and Gynecology

## 2021-05-07 ENCOUNTER — Encounter (HOSPITAL_COMMUNITY): Payer: Self-pay | Admitting: Certified Registered Nurse Anesthetist

## 2021-05-07 DIAGNOSIS — Z20822 Contact with and (suspected) exposure to covid-19: Secondary | ICD-10-CM | POA: Diagnosis present

## 2021-05-07 DIAGNOSIS — O1002 Pre-existing essential hypertension complicating childbirth: Secondary | ICD-10-CM | POA: Diagnosis present

## 2021-05-07 DIAGNOSIS — Z3A37 37 weeks gestation of pregnancy: Secondary | ICD-10-CM | POA: Diagnosis not present

## 2021-05-07 DIAGNOSIS — O329XX0 Maternal care for malpresentation of fetus, unspecified, not applicable or unspecified: Secondary | ICD-10-CM | POA: Diagnosis present

## 2021-05-07 DIAGNOSIS — O321XX0 Maternal care for breech presentation, not applicable or unspecified: Secondary | ICD-10-CM | POA: Diagnosis present

## 2021-05-07 LAB — CBC
HCT: 37.4 % (ref 36.0–46.0)
Hemoglobin: 12.7 g/dL (ref 12.0–15.0)
MCH: 29.2 pg (ref 26.0–34.0)
MCHC: 34 g/dL (ref 30.0–36.0)
MCV: 86 fL (ref 80.0–100.0)
Platelets: 219 10*3/uL (ref 150–400)
RBC: 4.35 MIL/uL (ref 3.87–5.11)
RDW: 12.8 % (ref 11.5–15.5)
WBC: 13 10*3/uL — ABNORMAL HIGH (ref 4.0–10.5)
nRBC: 0 % (ref 0.0–0.2)

## 2021-05-07 LAB — TYPE AND SCREEN
ABO/RH(D): A POS
Antibody Screen: NEGATIVE

## 2021-05-07 LAB — RPR: RPR Ser Ql: NONREACTIVE

## 2021-05-07 SURGERY — Surgical Case
Anesthesia: Spinal

## 2021-05-07 MED ORDER — SOD CITRATE-CITRIC ACID 500-334 MG/5ML PO SOLN: 30.0000 mL | ORAL | Status: DC

## 2021-05-07 MED ORDER — FENTANYL CITRATE (PF) 100 MCG/2ML IJ SOLN
INTRAMUSCULAR | Status: AC
  Filled 2021-05-07: qty 2

## 2021-05-07 MED ORDER — SIMETHICONE 80 MG PO CHEW
80.0000 mg | CHEWABLE_TABLET | Freq: Three times a day (TID) | ORAL | Status: DC
  Administered 2021-05-07 – 2021-05-09 (×5): 80 mg via ORAL
  Filled 2021-05-07 (×5): qty 1

## 2021-05-07 MED ORDER — NALBUPHINE HCL 10 MG/ML IJ SOLN
5.0000 mg | Freq: Once | INTRAMUSCULAR | Status: DC | PRN
Start: 2021-05-07 — End: 2021-05-09

## 2021-05-07 MED ORDER — NALBUPHINE HCL 10 MG/ML IJ SOLN: 5.0000 mg | Freq: Once | INTRAMUSCULAR | Status: DC | PRN

## 2021-05-07 MED ORDER — BUPIVACAINE IN DEXTROSE 0.75-8.25 % IT SOLN
INTRATHECAL | Status: DC | PRN
  Administered 2021-05-07: 1.6 mL via INTRATHECAL

## 2021-05-07 MED ORDER — COCONUT OIL OIL: 1.0000 "application " | TOPICAL_OIL | Status: DC | PRN

## 2021-05-07 MED ORDER — ACETAMINOPHEN 10 MG/ML IV SOLN
1000.0000 mg | Freq: Once | INTRAVENOUS | Status: DC | PRN
  Administered 2021-05-07: 1000 mg via INTRAVENOUS

## 2021-05-07 MED ORDER — LACTATED RINGERS IV SOLN: INTRAVENOUS | Status: DC

## 2021-05-07 MED ORDER — HYDROMORPHONE HCL 1 MG/ML IJ SOLN: 0.2000 mg | INTRAMUSCULAR | Status: DC | PRN

## 2021-05-07 MED ORDER — NALOXONE HCL 0.4 MG/ML IJ SOLN: 0.4000 mg | INTRAMUSCULAR | Status: DC | PRN

## 2021-05-07 MED ORDER — CEFAZOLIN SODIUM-DEXTROSE 2-4 GM/100ML-% IV SOLN
2.0000 g | INTRAVENOUS | Status: AC
Start: 2021-05-07 — End: 2021-05-07
  Administered 2021-05-07: 2 g via INTRAVENOUS

## 2021-05-07 MED ORDER — NIFEDIPINE ER OSMOTIC RELEASE 30 MG PO TB24
30.0000 mg | ORAL_TABLET | Freq: Once | ORAL | Status: AC
  Administered 2021-05-07: 30 mg via ORAL
  Filled 2021-05-07: qty 1

## 2021-05-07 MED ORDER — TERBUTALINE SULFATE 1 MG/ML IJ SOLN
0.2500 mg | Freq: Once | INTRAMUSCULAR | Status: AC
  Administered 2021-05-07: 0.25 mg via SUBCUTANEOUS

## 2021-05-07 MED ORDER — IBUPROFEN 600 MG PO TABS
600.0000 mg | ORAL_TABLET | Freq: Four times a day (QID) | ORAL | Status: DC
  Administered 2021-05-08 – 2021-05-09 (×4): 600 mg via ORAL
  Filled 2021-05-07 (×4): qty 1

## 2021-05-07 MED ORDER — SENNOSIDES-DOCUSATE SODIUM 8.6-50 MG PO TABS
2.0000 | ORAL_TABLET | ORAL | Status: DC
  Administered 2021-05-08: 2 via ORAL
  Filled 2021-05-07: qty 2

## 2021-05-07 MED ORDER — CEFAZOLIN SODIUM-DEXTROSE 2-4 GM/100ML-% IV SOLN: 2.0000 g | INTRAVENOUS | Status: DC

## 2021-05-07 MED ORDER — MENTHOL 3 MG MT LOZG: 1.0000 | LOZENGE | OROMUCOSAL | Status: DC | PRN

## 2021-05-07 MED ORDER — ACETAMINOPHEN 10 MG/ML IV SOLN
INTRAVENOUS | Status: AC
  Filled 2021-05-07: qty 100

## 2021-05-07 MED ORDER — ONDANSETRON HCL 4 MG/2ML IJ SOLN: 4.0000 mg | Freq: Three times a day (TID) | INTRAMUSCULAR | Status: DC | PRN

## 2021-05-07 MED ORDER — DIPHENHYDRAMINE HCL 50 MG/ML IJ SOLN: 12.5000 mg | INTRAMUSCULAR | Status: DC | PRN

## 2021-05-07 MED ORDER — SCOPOLAMINE 1 MG/3DAYS TD PT72
MEDICATED_PATCH | TRANSDERMAL | Status: AC
  Filled 2021-05-07: qty 1

## 2021-05-07 MED ORDER — BISACODYL 10 MG RE SUPP: 10.0000 mg | Freq: Every day | RECTAL | Status: DC | PRN

## 2021-05-07 MED ORDER — NIFEDIPINE ER OSMOTIC RELEASE 30 MG PO TB24
60.0000 mg | ORAL_TABLET | Freq: Every day | ORAL | Status: DC
  Administered 2021-05-07: 60 mg via ORAL
  Filled 2021-05-07: qty 2

## 2021-05-07 MED ORDER — FENTANYL CITRATE (PF) 100 MCG/2ML IJ SOLN
INTRAMUSCULAR | Status: DC | PRN
  Administered 2021-05-07: 15 ug via INTRATHECAL

## 2021-05-07 MED ORDER — NALOXONE HCL 4 MG/10ML IJ SOLN
1.0000 ug/kg/h | INTRAVENOUS | Status: DC | PRN
  Filled 2021-05-07: qty 5

## 2021-05-07 MED ORDER — LACTATED RINGERS IV SOLN: 500.0000 mL | INTRAVENOUS | Status: DC | PRN

## 2021-05-07 MED ORDER — KETOROLAC TROMETHAMINE 30 MG/ML IJ SOLN: 30.0000 mg | Freq: Four times a day (QID) | INTRAMUSCULAR | Status: DC | PRN

## 2021-05-07 MED ORDER — PHENYLEPHRINE HCL-NACL 20-0.9 MG/250ML-% IV SOLN
INTRAVENOUS | Status: DC | PRN
  Administered 2021-05-07: 30 ug/min via INTRAVENOUS

## 2021-05-07 MED ORDER — NALBUPHINE HCL 10 MG/ML IJ SOLN: 5.0000 mg | INTRAMUSCULAR | Status: DC | PRN

## 2021-05-07 MED ORDER — CEFAZOLIN SODIUM-DEXTROSE 2-4 GM/100ML-% IV SOLN
INTRAVENOUS | Status: AC
  Filled 2021-05-07: qty 100

## 2021-05-07 MED ORDER — DEXAMETHASONE SODIUM PHOSPHATE 4 MG/ML IJ SOLN
INTRAMUSCULAR | Status: DC | PRN
  Administered 2021-05-07: 5 mg via INTRAVENOUS

## 2021-05-07 MED ORDER — ONDANSETRON HCL 4 MG/2ML IJ SOLN
INTRAMUSCULAR | Status: AC
  Filled 2021-05-07: qty 2

## 2021-05-07 MED ORDER — PRENATAL MULTIVITAMIN CH
1.0000 | ORAL_TABLET | Freq: Every day | ORAL | Status: DC
  Administered 2021-05-08: 1 via ORAL
  Filled 2021-05-07: qty 1

## 2021-05-07 MED ORDER — FENTANYL CITRATE (PF) 100 MCG/2ML IJ SOLN
25.0000 ug | INTRAMUSCULAR | Status: DC | PRN
  Administered 2021-05-07: 50 ug via INTRAVENOUS

## 2021-05-07 MED ORDER — ACETAMINOPHEN 500 MG PO TABS
1000.0000 mg | ORAL_TABLET | Freq: Four times a day (QID) | ORAL | Status: DC
  Administered 2021-05-07 – 2021-05-09 (×7): 1000 mg via ORAL
  Filled 2021-05-07 (×5): qty 2

## 2021-05-07 MED ORDER — WITCH HAZEL-GLYCERIN EX PADS: 1.0000 "application " | MEDICATED_PAD | CUTANEOUS | Status: DC | PRN

## 2021-05-07 MED ORDER — FLEET ENEMA 7-19 GM/118ML RE ENEM: 1.0000 | ENEMA | Freq: Every day | RECTAL | Status: DC | PRN

## 2021-05-07 MED ORDER — TERBUTALINE SULFATE 1 MG/ML IJ SOLN
INTRAMUSCULAR | Status: AC
  Filled 2021-05-07: qty 1

## 2021-05-07 MED ORDER — SCOPOLAMINE 1 MG/3DAYS TD PT72
1.0000 | MEDICATED_PATCH | Freq: Once | TRANSDERMAL | Status: DC
  Administered 2021-05-07: 1.5 mg via TRANSDERMAL

## 2021-05-07 MED ORDER — MORPHINE SULFATE (PF) 0.5 MG/ML IJ SOLN
INTRAMUSCULAR | Status: AC
  Filled 2021-05-07: qty 10

## 2021-05-07 MED ORDER — KETOROLAC TROMETHAMINE 30 MG/ML IJ SOLN: 30.0000 mg | Freq: Once | INTRAMUSCULAR | Status: DC | PRN

## 2021-05-07 MED ORDER — KETOROLAC TROMETHAMINE 30 MG/ML IJ SOLN
30.0000 mg | Freq: Four times a day (QID) | INTRAMUSCULAR | Status: DC | PRN
  Administered 2021-05-07: 30 mg via INTRAVENOUS

## 2021-05-07 MED ORDER — DIPHENHYDRAMINE HCL 25 MG PO CAPS: 25.0000 mg | ORAL_CAPSULE | Freq: Four times a day (QID) | ORAL | Status: DC | PRN

## 2021-05-07 MED ORDER — OXYTOCIN-SODIUM CHLORIDE 30-0.9 UT/500ML-% IV SOLN
INTRAVENOUS | Status: AC
  Filled 2021-05-07: qty 500

## 2021-05-07 MED ORDER — SODIUM CHLORIDE 0.9 % IR SOLN
Status: DC | PRN
  Administered 2021-05-07: 1

## 2021-05-07 MED ORDER — PHENYLEPHRINE HCL-NACL 20-0.9 MG/250ML-% IV SOLN
INTRAVENOUS | Status: AC
  Filled 2021-05-07: qty 250

## 2021-05-07 MED ORDER — KETOROLAC TROMETHAMINE 30 MG/ML IJ SOLN
INTRAMUSCULAR | Status: AC
  Filled 2021-05-07: qty 1

## 2021-05-07 MED ORDER — ONDANSETRON HCL 4 MG/2ML IJ SOLN
INTRAMUSCULAR | Status: DC | PRN
  Administered 2021-05-07: 4 mg via INTRAVENOUS

## 2021-05-07 MED ORDER — DIPHENHYDRAMINE HCL 25 MG PO CAPS: 25.0000 mg | ORAL_CAPSULE | ORAL | Status: DC | PRN

## 2021-05-07 MED ORDER — DIBUCAINE (PERIANAL) 1 % EX OINT: 1.0000 "application " | TOPICAL_OINTMENT | CUTANEOUS | Status: DC | PRN

## 2021-05-07 MED ORDER — OXYTOCIN-SODIUM CHLORIDE 30-0.9 UT/500ML-% IV SOLN
INTRAVENOUS | Status: DC | PRN
  Administered 2021-05-07: 100 mL via INTRAVENOUS
  Administered 2021-05-07: 150 mL via INTRAVENOUS

## 2021-05-07 MED ORDER — OXYTOCIN-SODIUM CHLORIDE 30-0.9 UT/500ML-% IV SOLN
2.5000 [IU]/h | INTRAVENOUS | Status: AC
  Administered 2021-05-07: 2.5 [IU]/h via INTRAVENOUS
  Filled 2021-05-07: qty 500

## 2021-05-07 MED ORDER — SODIUM CHLORIDE 0.9% FLUSH: 3.0000 mL | INTRAVENOUS | Status: DC | PRN

## 2021-05-07 MED ORDER — DEXAMETHASONE SODIUM PHOSPHATE 4 MG/ML IJ SOLN
INTRAMUSCULAR | Status: AC
  Filled 2021-05-07: qty 1

## 2021-05-07 MED ORDER — ACETAMINOPHEN 500 MG PO TABS
1000.0000 mg | ORAL_TABLET | Freq: Four times a day (QID) | ORAL | Status: DC
  Filled 2021-05-07 (×2): qty 2

## 2021-05-07 MED ORDER — SIMETHICONE 80 MG PO CHEW: 80.0000 mg | CHEWABLE_TABLET | ORAL | Status: DC | PRN

## 2021-05-07 MED ORDER — KETOROLAC TROMETHAMINE 30 MG/ML IJ SOLN
30.0000 mg | Freq: Four times a day (QID) | INTRAMUSCULAR | Status: AC
  Administered 2021-05-07 – 2021-05-08 (×3): 30 mg via INTRAVENOUS
  Filled 2021-05-07 (×3): qty 1

## 2021-05-07 MED ORDER — MORPHINE SULFATE (PF) 0.5 MG/ML IJ SOLN
INTRAMUSCULAR | Status: DC | PRN
  Administered 2021-05-07: .15 mg via INTRATHECAL

## 2021-05-07 MED ORDER — OXYCODONE HCL 5 MG PO TABS
5.0000 mg | ORAL_TABLET | ORAL | Status: DC | PRN
  Administered 2021-05-07: 5 mg via ORAL
  Administered 2021-05-08 – 2021-05-09 (×5): 10 mg via ORAL
  Filled 2021-05-07 (×2): qty 2
  Filled 2021-05-07: qty 1
  Filled 2021-05-07 (×2): qty 2
  Filled 2021-05-07 (×2): qty 1

## 2021-05-07 SURGICAL SUPPLY — 30 items
APL SKNCLS STERI-STRIP NONHPOA (GAUZE/BANDAGES/DRESSINGS) ×1
BENZOIN TINCTURE PRP APPL 2/3 (GAUZE/BANDAGES/DRESSINGS) ×3 IMPLANT
CLAMP CORD UMBIL (MISCELLANEOUS) ×3 IMPLANT
CLOSURE STERI STRIP 1/2 X4 (GAUZE/BANDAGES/DRESSINGS) ×2 IMPLANT
CLOTH BEACON ORANGE TIMEOUT ST (SAFETY) ×3 IMPLANT
DRSG OPSITE POSTOP 4X10 (GAUZE/BANDAGES/DRESSINGS) ×3 IMPLANT
ELECT REM PT RETURN 9FT ADLT (ELECTROSURGICAL) ×3
ELECTRODE REM PT RTRN 9FT ADLT (ELECTROSURGICAL) ×1 IMPLANT
EXTRACTOR VACUUM BELL STYLE (SUCTIONS) ×3 IMPLANT
GLOVE SURG LTX SZ6 (GLOVE) ×3 IMPLANT
GLOVE SURG UNDER POLY LF SZ6.5 (GLOVE) ×3 IMPLANT
GOWN STRL REUS W/TWL LRG LVL3 (GOWN DISPOSABLE) ×6 IMPLANT
KIT ABG SYR 3ML LUER SLIP (SYRINGE) ×3 IMPLANT
NDL HYPO 25X5/8 SAFETYGLIDE (NEEDLE) ×1 IMPLANT
NEEDLE HYPO 25X5/8 SAFETYGLIDE (NEEDLE) ×3 IMPLANT
NS IRRIG 1000ML POUR BTL (IV SOLUTION) ×3 IMPLANT
PACK C SECTION WH (CUSTOM PROCEDURE TRAY) ×3 IMPLANT
PAD OB MATERNITY 4.3X12.25 (PERSONAL CARE ITEMS) ×3 IMPLANT
PENCIL SMOKE EVAC W/HOLSTER (ELECTROSURGICAL) ×3 IMPLANT
RTRCTR C-SECT PINK 25CM LRG (MISCELLANEOUS) ×3 IMPLANT
STRIP CLOSURE SKIN 1/2X4 (GAUZE/BANDAGES/DRESSINGS) ×2 IMPLANT
SUT MNCRL 0 VIOLET CTX 36 (SUTURE) ×2 IMPLANT
SUT MONOCRYL 0 CTX 36 (SUTURE) ×6
SUT VIC AB 0 CT1 36 (SUTURE) ×6 IMPLANT
SUT VIC AB 3-0 CT1 27 (SUTURE) ×3
SUT VIC AB 3-0 CT1 TAPERPNT 27 (SUTURE) ×1 IMPLANT
SUT VIC AB 4-0 KS 27 (SUTURE) ×3 IMPLANT
TOWEL OR 17X24 6PK STRL BLUE (TOWEL DISPOSABLE) ×3 IMPLANT
TRAY FOLEY W/BAG SLVR 14FR LF (SET/KITS/TRAYS/PACK) ×3 IMPLANT
WATER STERILE IRR 1000ML POUR (IV SOLUTION) ×3 IMPLANT

## 2021-05-07 NOTE — Anesthesia Procedure Notes (Signed)
Spinal  Patient location during procedure: OR Start time: 05/07/2021 10:00 AM End time: 05/07/2021 10:03 AM Reason for block: surgical anesthesia Staffing Performed: anesthesiologist  Anesthesiologist: Elmer Picker, MD Preanesthetic Checklist Completed: patient identified, IV checked, risks and benefits discussed, surgical consent, monitors and equipment checked, pre-op evaluation and timeout performed Spinal Block Patient position: sitting Prep: DuraPrep and site prepped and draped Patient monitoring: cardiac monitor, continuous pulse ox and blood pressure Approach: midline Location: L3-4 Injection technique: single-shot Needle Needle type: Pencan  Needle gauge: 24 G Needle length: 9 cm Assessment Sensory level: T6 Events: CSF return Additional Notes Functioning IV was confirmed and monitors were applied. Sterile prep and drape, including hand hygiene and sterile gloves were used. The patient was positioned and the spine was prepped. The skin was anesthetized with lidocaine.  Free flow of clear CSF was obtained prior to injecting local anesthetic into the CSF.  The spinal needle aspirated freely following injection.  The needle was carefully withdrawn.  The patient tolerated the procedure well.

## 2021-05-07 NOTE — Op Note (Addendum)
Procedure(s): CESAREAN SECTION Procedure Note  Aranda Bihm female 35 y.o. 05/07/2021  Procedure(s) and Anesthesia Type:    * CESAREAN SECTION - Regional   PRE-OPERATIVE DIAGNOSIS:  Breech, failed version  POST-OPERATIVE DIAGNOSIS:  Breech, worsening chronic hypertension  PROCEDURE:  Procedure(s): CESAREAN SECTION (N/A)  SURGEON:  Surgeon(s) and Role:    * Taam-Akelman, Griselda Miner, MD - Primary    * Carrington Clamp, MD - Assisting  Indications: Kelly Moses 35 y.o. [phone] [redacted]w[redacted]d presented for scheduled ECV, ECV was not successful, due to worsening CHTN, proceeded with delivery today by primary CS  Anesthesia: Spinal anesthesia   Procedure Detail  CESAREAN SECTION  Findings: Normal mullerian anatomy.  Viable female "[name]" infant. Apgars 9 and 9. Weight: 2970 g (6 lb 8.8 oz)  Estimated Blood Loss:   440cc         Specimens: Placenta to L&D         Complications:  None         Disposition: PACU - hemodynamically stable.         Condition: stable    Description of Procedure: The patient was taken to the operating room where epidural anesthesia was found to be adequate.  The patient was placed in the dorsal supine position.  Fetal heart tones were confirmed.  The patient was subsequently prepped and draped in the normal sterile fashion.    A low transverse skin incision was made with a scalpel and carried down to the level of the fascia with the Bovie.  The fascia was incised in the midline with the scalpel and extended laterally with curved Mayo scissors.  Kocher clamps were applied to the superior fascial edge and the fascia was dissected off the rectus muscle sharply using the scalpel.  The Kocher clamps were transferred to the inferior fascial edge and the underlying rectus muscle was dissected off with curved Mayo's scissors.  The rectus muscles then were separated in the midline.  The peritoneum was found free of adherent bowel and the peritoneal cavity was  entered bluntly.  The uterus was identified and the alexis retractor was placed intraperitoneal.  A bladder flap was then created sharply with Metzenbaum scissors and separated from the lower uterine segment digitally.   A low transverse hysterotomy was then made with a scalpel.  The infant was found in the breech  presentation, was delivered atraumatically and without difficulty in the usual fashion.  After 60 seconds of delayed cord clamping the cord was clamped and cut and the infant was handed off to the pediatricians.  The placenta was delivered with gentle traction on umbilical cord and manual massage of the uterine fundus, the cord avulsed during delivery and the placenta was manually removed.  The uterus was cleared of all clot and debris.  The hysterotomy was then closed with 0 Monocryl in a running locked fashion.  Followed by 0 Monocryl in an imbricating fashion.  Additional figure of eight at the right apex with 0 Monocryl. The hysterotomy was found to be hemostatic.  The fascia was closed with a 0 Vicryl suture in a continuous running fashion from each side and tied in the midline.  The subcutaneous tissue was irrigated and rendered hemostatic with cautery.  The subcutaneous layer was subsequently closed with 2-0 Vicryl in a continuous running fashion.  The skin was closed with vicryl on Keith needle in a running subcuticular fashion.  Sponge, lap and needle counts were correct. Honeycomb dressing was placed on the incision.  Keison Glendinning K Taam-Akelman 05/07/21 11:03 AM

## 2021-05-07 NOTE — Anesthesia Preprocedure Evaluation (Signed)
Anesthesia Evaluation    Reviewed: Allergy & Precautions, Patient's Chart, lab work & pertinent test results  Airway Mallampati: II  TM Distance: >3 FB Neck ROM: Full    Dental no notable dental hx.    Pulmonary neg pulmonary ROS, former smoker,    Pulmonary exam normal breath sounds clear to auscultation       Cardiovascular hypertension (cHTN), Normal cardiovascular exam Rhythm:Regular Rate:Normal     Neuro/Psych PSYCHIATRIC DISORDERS Anxiety Bipolar Disorder negative neurological ROS     GI/Hepatic negative GI ROS, (+)     substance abuse (no drug use since 2018)  alcohol use, cocaine use, methamphetamine use and IV drug use,   Endo/Other  negative endocrine ROS  Renal/GU negative Renal ROS  negative genitourinary   Musculoskeletal negative musculoskeletal ROS (+)   Abdominal   Peds  Hematology negative hematology ROS (+)   Anesthesia Other Findings G2P1 primary C/S for breech  Reproductive/Obstetrics (+) Pregnancy                             Anesthesia Physical Anesthesia Plan  ASA: 3  Anesthesia Plan: Spinal   Post-op Pain Management:    Induction:   PONV Risk Score and Plan: Treatment may vary due to age or medical condition  Airway Management Planned: Natural Airway  Additional Equipment:   Intra-op Plan:   Post-operative Plan:   Informed Consent: I have reviewed the patients History and Physical, chart, labs and discussed the procedure including the risks, benefits and alternatives for the proposed anesthesia with the patient or authorized representative who has indicated his/her understanding and acceptance.     Dental advisory given  Plan Discussed with: CRNA  Anesthesia Plan Comments:         Anesthesia Quick Evaluation

## 2021-05-07 NOTE — Lactation Note (Signed)
This note was copied from a baby's chart. Lactation Consultation Note  Patient Name: Kelly Moses [id number] Date: 05/07/2021 Reason for consult: Initial assessment;Early term 37-38.6wks;Mother's request Age:37 hours, infant had 4 stools that is black greenish in color, LC changed large stool while in room. Mom latched infant on her right breast using the football hold position, infant active suckled but was on and off the breast during the feeding, 10 minutes in length. LC did suck training and work with infant flanging her top lip out more prior to mom latching infant at the breast, infant does appear to have  tight upper lingual frenulum , per mom, she had gap and had to have braces as a child. Mom will continue to work on latching infant on the breast, flanging infant's top lip outward for latch and mom will ask RN or LC for assistance with latch if needed.  Afterwards mom easily expressed 5 mls of colostrum that was spoon fed to infant.  Mom will continue to BF infant according to hunger cues, 8 to 12 or more times within 24 hours, STS. LC discussed infant's input and output with parents. Mom made aware of O/P services, breastfeeding support groups, community resources, and our phone # for post-discharge questions.   Maternal Data Has patient been taught Hand Expression?: Yes Does the patient have breastfeeding experience prior to this delivery?: Yes How long did the patient breastfeed?: Per mom, she BF her 78 year old daughter for 5 months  Feeding Mother's Current Feeding Choice: Breast Milk  LATCH Score Latch: Repeated attempts needed to sustain latch, nipple held in mouth throughout feeding, stimulation needed to elicit sucking reflex.  Audible Swallowing: A few with stimulation  Type of Nipple: Everted at rest and after stimulation (Most of mom's nipple is everted she has pin size inversion, she doesn't need hand pump nor shells.)  Comfort (Breast/Nipple): Soft /  non-tender  Hold (Positioning): Assistance needed to correctly position infant at breast and maintain latch.  LATCH Score: 7   Lactation Tools Discussed/Used    Interventions Interventions: Breast feeding basics reviewed;Assisted with latch;Skin to skin;Hand express;Breast compression;Adjust position;Support pillows;Position options;Expressed milk;Education  Discharge Pump: Personal WIC Program: No  Consult Status Consult Status: Follow-up Date: 05/08/21 Follow-up type: In-patient    Danelle Earthly 05/07/2021, 10:54 PM

## 2021-05-07 NOTE — Transfer of Care (Signed)
Immediate Anesthesia Transfer of Care Note  Patient: Kelly Moses  Procedure(s) Performed: CESAREAN SECTION  Patient Location: PACU  Anesthesia Type:Spinal  Level of Consciousness: awake, alert  and oriented  Airway & Oxygen Therapy: Patient Spontanous Breathing  Post-op Assessment: Report given to RN and Post -op Vital signs reviewed and stable  Post vital signs: Reviewed and stable  Last Vitals:  Vitals Value Taken Time  BP 124/85 05/07/21 1107  Temp    Pulse 90 05/07/21 1112  Resp 15 05/07/21 1112  SpO2 100 % 05/07/21 1112  Vitals shown include unvalidated device data.  Last Pain:  Vitals:   05/07/21 0736  TempSrc: Oral  PainSc: 0-No pain         Complications: No notable events documented.

## 2021-05-07 NOTE — H&P (Signed)
35 y.o. [redacted]w[redacted]d  G2P1001 comes in for ECV for breech.  Otherwise has good fetal movement and no bleeding. No pre-e sx.   Past Medical History:  Diagnosis Date   Hypertension    Polysubstance abuse (HCC)     Past Surgical History:  Procedure Laterality Date   WISDOM TOOTH EXTRACTION      OB History  Gravida Para Term Preterm AB Living  2 1 1     1   SAB IAB Ectopic Multiple Live Births          1    # Outcome Date GA Lbr Len/2nd Weight Sex Delivery Anes PTL Lv  2 Current           1 Term 2010     Vag-Spont   LIV    Social History   Socioeconomic History   Marital status: Single    Spouse name: Not on file   Number of children: 1   Years of education: high school and 2 yrs college   Highest education level: Some college, no degree  Occupational History   Not on file  Tobacco Use   Smoking status: Former    Pack years: 0.00   Smokeless tobacco: Never   Tobacco comments:    1 pod/day  Vaping Use   Vaping Use: Former   Substances: Nicotine  Substance and Sexual Activity   Alcohol use: Yes    Alcohol/week: 0.0 - 1.0 standard drinks    Comment: rare    Drug use: Not Currently    Types: Cocaine, Heroin, Benzodiazepines, Methamphetamines    Comment: Rehab at 2011 in 2018.  Clean since then.   Sexual activity: Not on file    Comment: not answered  Other Topics Concern   Not on file  Social History Narrative   Not on file   Social Determinants of Health   Financial Resource Strain: Not on file  Food Insecurity: Not on file  Transportation Needs: Not on file  Physical Activity: Not on file  Stress: Not on file  Social Connections: Not on file  Intimate Partner Violence: Not on file   Sulfa antibiotics    Prenatal Transfer Tool  Maternal Diabetes: No Genetic Screening: Normal Maternal Ultrasounds/Referrals: Normal Fetal Ultrasounds or other Referrals:  None Maternal Substance Abuse:  No Significant Maternal Medications:  Meds include: Other:   Significant Maternal Lab Results: Procardia 60 XLGroup B Strep negative  Other PNC: Pt AMA with CHTN and needing increase in meds during last few weeks.  Also borderline oligo- fluid has been low normal.  Breech in office, desires ECV.    Vitals:   05/07/21 0736  BP: (!) 149/98  Pulse: 98  Resp: 18  Temp: 97.9 F (36.6 C)  TempSrc: Oral  SpO2: 98%    Lungs/Cor:  NAD Abdomen:  soft, gravid Ex:  no cords, erythema SVE:  N/A FHTs:  120s, good STV, NST R; Cat 1 tracing. Toco:  q occ  05/09/21 today Breech with head and spine to maternal R.  Subjectively low fluid.  A/P   Term CHTN with worsening BPs, AMA, for ECV and induction iff successful.    GBS NEG.  [place]

## 2021-05-07 NOTE — Anesthesia Postprocedure Evaluation (Signed)
Anesthesia Post Note  Patient: Kelly Moses  Procedure(s) Performed: CESAREAN SECTION     Patient location during evaluation: PACU Anesthesia Type: Spinal Level of consciousness: oriented and awake and alert Pain management: pain level controlled Vital Signs Assessment: post-procedure vital signs reviewed and stable Respiratory status: spontaneous breathing, respiratory function stable and patient connected to nasal cannula oxygen Cardiovascular status: blood pressure returned to baseline and stable Postop Assessment: no headache, no backache and no apparent nausea or vomiting Anesthetic complications: no   No notable events documented.  Last Vitals:  Vitals:   05/07/21 1200 05/07/21 1215  BP: (!) 136/92 (!) 150/97  Pulse: 88 86  Resp: 19 16  Temp: 36.7 C 36.8 C  SpO2: 100% 100%    Last Pain:  Vitals:   05/07/21 1220  TempSrc:   PainSc: 0-No pain   Pain Goal:                Epidural/Spinal Function Cutaneous sensation: Able to Wiggle Toes (05/07/21 1220), Patient able to flex knees: Yes (05/07/21 1220), Patient able to lift hips off bed: Yes (05/07/21 1220), Back pain beyond tenderness at insertion site: No (05/07/21 1220), Progressively worsening motor and/or sensory loss: No (05/07/21 1220), Bowel and/or bladder incontinence post epidural: No (05/07/21 1220)  [name]

## 2021-05-07 NOTE — Progress Notes (Signed)
Pt was consented for procedure and terbutaline given.  Dr. Henderson Cloud and Dr. Timothy Lasso at bedside.  [place] was again used to identify baby in breech positiion. Three attempts were made with two physicians, one pushing fetus out of pelvis and other pulling head around to turn fetus.  FHTs normal between attempts.  Unable to move fetal head at all from maternal RUQ position.   Pt tolerated procedure well. FHTs NST R post procedure.  Pt consented for c/s, will proceed when labs are back.

## 2021-05-08 LAB — CBC
HCT: 30 % — ABNORMAL LOW (ref 36.0–46.0)
Hemoglobin: 10.3 g/dL — ABNORMAL LOW (ref 12.0–15.0)
MCH: 29.4 pg (ref 26.0–34.0)
MCHC: 34.3 g/dL (ref 30.0–36.0)
MCV: 85.7 fL (ref 80.0–100.0)
Platelets: 192 10*3/uL (ref 150–400)
RBC: 3.5 MIL/uL — ABNORMAL LOW (ref 3.87–5.11)
RDW: 12.8 % (ref 11.5–15.5)
WBC: 21.9 10*3/uL — ABNORMAL HIGH (ref 4.0–10.5)
nRBC: 0 % (ref 0.0–0.2)

## 2021-05-08 MED ORDER — NIFEDIPINE ER OSMOTIC RELEASE 30 MG PO TB24
90.0000 mg | ORAL_TABLET | Freq: Every day | ORAL | Status: DC
  Administered 2021-05-08 – 2021-05-09 (×2): 90 mg via ORAL
  Filled 2021-05-08 (×2): qty 3

## 2021-05-08 NOTE — Progress Notes (Signed)
Subjective: Postpartum Day 1: Cesarean Delivery Kelly Moses is doing great this morning. She reports incisional pain is well controlled with pain regimen. She has ambulated to the bathroom and voided. She is tolerating PO. Reports mild gas pain. No vision changes, RUQ pain, headache, chest pain, shortness of breath, calf pain.   Objective: Patient Vitals for the past 24 hrs:  BP Temp Temp src Pulse Resp SpO2  05/08/21 0901 134/88 97.7 F (36.5 C) Oral 85 17 --  05/08/21 0510 (!) 134/95 98.2 F (36.8 C) Oral 80 18 --  05/08/21 0218 (!) 127/94 97.9 F (36.6 C) Oral 79 18 --  05/07/21 2228 (!) 137/99 98.6 F (37 C) Oral 68 16 --  05/07/21 1814 -- 97.9 F (36.6 C) Oral -- 18 100 %  05/07/21 1711 -- -- -- -- 17 99 %  05/07/21 1507 138/90 98.2 F (36.8 C) -- 90 17 100 %  05/07/21 1415 (!) 136/96 98 F (36.7 C) Oral 87 18 --  05/07/21 1301 (!) 135/97 98 F (36.7 C) Oral 87 17 99 %  05/07/21 1215 (!) 150/97 98.3 F (36.8 C) -- 86 16 100 %  05/07/21 1200 (!) 136/92 98.1 F (36.7 C) Oral 88 19 100 %  05/07/21 1145 (!) 136/91 98.1 F (36.7 C) Oral 81 18 100 %  05/07/21 1130 (!) 144/89 -- -- 91 17 100 %  05/07/21 1115 (!) 132/97 -- -- 89 15 100 %  05/07/21 1107 124/85 98.6 F (37 C) -- 91 16 100 %     Physical Exam:  General: alert, cooperative, and no distress Lochia: appropriate Uterine Fundus: firm Incision: covered with clean/dry dressing DVT Evaluation: No evidence of DVT seen on physical exam.  Recent Labs    05/07/21 0742 05/08/21 0514  HGB 12.7 10.3*  HCT 37.4 30.0*    Assessment/Plan:  Kelly Moses [id number] POD#1 sp primary cesarean at [redacted]w[redacted]d for malpresentation 1. PPC: continue routine postpartum care 2. Chronic hypertension: Procardia XL increased to 90mg  this morning due to persistent mild range Bps. If still elevated, will plan to add labetalol.  3. History of depression and substance abuse: social work consult requested 4. Rh pos, rubella immune, s/p tdap and  COVID vaccines 5. Dispo: pending BP control  05/08/2021, 9:12 AM

## 2021-05-08 NOTE — Social Work (Signed)
CSW received consult for hx of Anxiety and Substance use.  CSW met with MOB to offer support and complete assessment.    -Per chart review, it is noted that MOB admitted [facility], [facility] and has been clean since then.   CSW met with MOB at bedside. CSW observed MOB siting up in bed and FOB was swaddling, holding and bonding with the infant. CSW congratulated MOB and FOB. CSW offered MOB privacy during the assessment. MOB preferred that FOB stay. MOB presented bright, calm and receptive to [place] visit. CSW inquired how MOB has felt since giving birth. MOB reports feeling good.   CSW inquired about MOB history of anxiety. MOB acknowledges a history of anxiety and depression. MOB reports she does not currently take medications for anxiety or depression. MOB reports she takes Trazodone 51m for sleep. MOB reports she copes well with anxiety and depression by exercising and eating healthy meals. CSW praised MOB coping skills. CSW inquired if MOB has been to therapy. MOB reports she was in therapy years ago and found it very helpful. MOB reports she really enjoyed therapy and is not opposed to trying it again in the future. CSW provided education regarding the baby blues period vs. perinatal mood disorders, discussed treatment and gave resources for mental health follow up if concerns arise.  CSW recommended MOB complete self-evaluation during the postpartum time period using the New Mom Checklist from Postpartum Progress and encouraged MOB to contact a medical professional if symptoms are noted at any time. MOB reports she will reach out to her OBGYN if needed. CSW assessed MOB for safety. MOB denies thoughts of harm to self and others. CSW inquired about MOB supports. MOB acknowledges FOB, mom, sister, FOB parents and friends as supports.    CSW provided review of Sudden Infant Death Syndrome (SIDS) precautions and informed MOB no co-sleeping with the infant. MOB reports the infant will  sleep in a bassinet. CSW inquired if MOB has essential items for the infant. MOB reports she has essential items for the infant. [facility] inquired if MOB has transportation appointments. MOB confirm she has transportation to appointments.  CSW assessed MOB for additional needs. MOB reports no further need.    CSW identifies no further need for intervention and no barriers to discharge at this time.   NKathrin Greathouse MSW, LCSW [facility]  [phone]/29/2022  12:08 PM

## 2021-05-08 NOTE — Lactation Note (Signed)
This note was copied from a baby's chart. Lactation Consultation Note  Patient Name: Kelly Moses Date: 05/08/2021 Reason for consult: Follow-up assessment Age:37 hours  LC in to room per mother's request. Infant is favoring right breast over left. Observed a good deep latch football latch to right breast, optimal position and alignment. Assisted with transition to left breast cross cradle successfully. LC talked about clusterfeeding. Talked about weight loss, voids and stools as signs of good milk transfer.   Plan: 1-Skin to skin 2-Aim for a deep, comfortable latch 3-Breastfeeding on demand or 8-12 times in 24h period. 4-Keep infant awake during breastfeeding session: massaging breast, infant's hand/shoulder/feet 5-Monitor voids and stools as signs good intake.  6-Encouraged maternal rest, hydration and food intake.  7-Contact LC as needed for feeds/support/concerns/questions   All questions answered at this time.    Feeding Mother's Current Feeding Choice: Breast Milk  LATCH Score Latch: Grasps breast easily, tongue down, lips flanged, rhythmical sucking.  Audible Swallowing: Spontaneous and intermittent  Type of Nipple: Everted at rest and after stimulation  Comfort (Breast/Nipple): Soft / non-tender  Hold (Positioning): Assistance needed to correctly position infant at breast and maintain latch.  LATCH Score: 9  Interventions Interventions: Breast feeding basics reviewed;Assisted with latch;Skin to skin;Breast massage;Adjust position;Expressed milk;Position options;Support pillows;Education  Consult Status Consult Status: Follow-up Date: 05/09/21 Follow-up type: In-patient    Flower Franko A Higuera Ancidey 05/08/2021, 3:07 PM

## 2021-05-09 MED ORDER — NIFEDIPINE ER OSMOTIC RELEASE 90 MG PO TB24: 90.0000 mg | ORAL_TABLET | Freq: Every day | ORAL | 3 refills | Status: DC

## 2021-05-09 MED ORDER — OXYCODONE HCL 5 MG PO TABS: 5.0000 mg | ORAL_TABLET | Freq: Four times a day (QID) | ORAL | 0 refills | Status: DC | PRN

## 2021-05-09 MED ORDER — IBUPROFEN 600 MG PO TABS: 600.0000 mg | ORAL_TABLET | Freq: Four times a day (QID) | ORAL | 0 refills | Status: DC

## 2021-05-09 NOTE — Progress Notes (Signed)
Patient is doing well.  She is ambulating, voiding, tolerating PO.  Pain control is good.  Lochia is appropriate  Vitals:   05/08/21 0901 05/08/21 1715 05/08/21 2021 05/09/21 0459  BP: 134/88 134/86 128/82 127/86  Pulse: 85 92 85 78  Resp: 17 16 18 18   Temp: 97.7 F (36.5 C) 98.2 F (36.8 C) 98 F (36.7 C) 97.8 F (36.6 C)  TempSrc: Oral Axillary Oral Oral  SpO2:    100%  Weight:      Height:        NAD Fundus firm Incision c/d/i Ext: no edema  Lab Results  Component Value Date   WBC 21.9 (H) 05/08/2021   HGB 10.3 (L) 05/08/2021   HCT 30.0 (L) 05/08/2021   MCV 85.7 05/08/2021   PLT 192 05/08/2021    --/--/A POS (06/28 0740)/RImmune  A/P 34 y.o. 05-04-1978 POD#2 s/p primary c/s for breech presentation and worsening CHTN CTHN:  BPs much improved with increase to procardia XL 90mg  daily.  Will plan f/u BP check in office next week.  Kelly Moses will monitor daily with home BP cuff and call if concerns.   [facility] GEFFEL [name]

## 2021-05-09 NOTE — Discharge Summary (Signed)
Postpartum Discharge Summary  Date of Service updated 05/09/21     Patient Name: Kelly Moses DOB: 24-May-1986 MRN: [id number]  Date of admission: 05/07/2021 Delivery date:05/07/2021  Delivering provider: Janey Greaser K  Date of discharge: 05/09/2021  Admitting diagnosis: Malpresentation of fetus [O32.9XX0] Intrauterine pregnancy: [redacted]w[redacted]d     Secondary diagnosis:  Active Problems:   Malpresentation of fetus  Additional problems: Chronic hypertension    Discharge diagnosis: Term Pregnancy Delivered and CHTN                                              Post partum procedures: n/a Augmentation: N/A Complications: None  Hospital course:    35 y.o. yo G2P2002 at [redacted]w[redacted]d was admitted to the hospital 05/07/2021 for scheduled ECV.  Failed ECV.  Given worsening chronic hypertension, she proceeded to undergo a primary cesarean section with the following indication:Malpresentation and   .Delivery details are as follows:  Membrane Rupture Time/Date: 10:20 AM ,05/07/2021   Delivery Method:C-Section, Low Transverse  Details of operation can be found in separate operative note.  Patient had an uncomplicated postpartum course.  She is ambulating, tolerating a regular diet, passing flatus, and urinating well. Patient is discharged home in stable condition on  05/09/21        Newborn Data: Birth date:05/07/2021  Birth time:10:21 AM  Gender:Female  Living status:Living  Apgars:9 ,9  Weight:2970 g      Physical exam  Vitals:   05/08/21 0901 05/08/21 1715 05/08/21 2021 05/09/21 0459  BP: 134/88 134/86 128/82 127/86  Pulse: 85 92 85 78  Resp: 17 16 18 18   Temp: 97.7 F (36.5 C) 98.2 F (36.8 C) 98 F (36.7 C) 97.8 F (36.6 C)  TempSrc: Oral Axillary Oral Oral  SpO2:    100%  Weight:      Height:       General: alert, cooperative, and no distress Lochia: appropriate Uterine Fundus: firm Incision: Healing well with no significant drainage DVT Evaluation: No evidence of DVT seen  on physical exam. Labs: Lab Results  Component Value Date   WBC 21.9 (H) 05/08/2021   HGB 10.3 (L) 05/08/2021   HCT 30.0 (L) 05/08/2021   MCV 85.7 05/08/2021   PLT 192 05/08/2021   CMP Latest Ref Rng & Units 04/16/2021  Glucose 70 - 99 mg/dL 93  BUN 6 - 20 mg/dL 6  Creatinine 06/16/2021 - 0.09 mg/dL 3.81  Sodium 8.29 - 937 mmol/L 137  Potassium 3.5 - 5.1 mmol/L 4.1  Chloride 98 - 111 mmol/L 103  CO2 22 - 32 mmol/L 25  Calcium 8.9 - 10.3 mg/dL 169)  Total Protein 6.5 - 8.1 g/dL 5.9(L)  Total Bilirubin 0.3 - 1.2 mg/dL 0.5  Alkaline Phos 38 - 126 U/L 95  AST 15 - 41 U/L 21  ALT 0 - 44 U/L 14   Edinburgh Score: Edinburgh Postnatal Depression Scale Screening Tool 05/07/2021  I have been able to laugh and see the funny side of things. 0  I have looked forward with enjoyment to things. 0  I have blamed myself unnecessarily when things went wrong. 1  I have been anxious or worried for no good reason. 2  I have felt scared or panicky for no good reason. 1  Things have been getting on top of me. 1  I have been so unhappy that I  have had difficulty sleeping. 1  I have felt sad or miserable. 0  I have been so unhappy that I have been crying. 0  The thought of harming myself has occurred to me. 0  Edinburgh Postnatal Depression Scale Total 6      After visit meds:  Allergies as of 05/09/2021       Reactions   Sulfa Antibiotics Rash        Medication List     STOP taking these medications    aspirin EC 81 MG tablet   buPROPion 300 MG 24 hr tablet Commonly known as: WELLBUTRIN XL   naltrexone 50 MG tablet Commonly known as: DEPADE   QUEtiapine 100 MG tablet Commonly known as: SEROquel   traZODone 50 MG tablet Commonly known as: DESYREL       TAKE these medications    amphetamine-dextroamphetamine 20 MG tablet Commonly known as: ADDERALL Take 20 mg by mouth 3 (three) times daily.   ibuprofen 600 MG tablet Commonly known as: ADVIL Take 1 tablet (600 mg total) by  mouth every 6 (six) hours.   NIFEdipine 90 MG 24 hr tablet Commonly known as: PROCARDIA XL/NIFEDICAL-XL Take 1 tablet (90 mg total) by mouth daily. What changed:  medication strength how much to take   oxyCODONE 5 MG immediate release tablet Commonly known as: Oxy IR/ROXICODONE Take 1-2 tablets (5-10 mg total) by mouth every 6 (six) hours as needed for moderate pain.   prenatal multivitamin Tabs tablet Take 1 tablet by mouth daily at 12 noon.               Discharge Care Instructions  (From admission, onward)           Start     Ordered   05/09/21 0000  No dressing needed        05/09/21 0853             Discharge home in stable condition Infant Feeding: Breast Infant Disposition:home with mother Discharge instruction: per After Visit Summary and Postpartum booklet. Activity: Advance as tolerated. Pelvic rest for 6 weeks.  Diet: routine diet  Postpartum Appointment:1 week Additional Postpartum F/U: BP check 1 week Future Appointments:No future appointments. Follow up Visit:  Follow-up Information     Marlow Baars, MD Follow up in 1 week(s).   Specialty: Obstetrics Why: BP check Contact information: [address] [place] [address] [phone]                     05/09/2021 [facility] Lizabeth Leyden, MD

## 2021-05-09 NOTE — Lactation Note (Signed)
This note was copied from a baby's chart. Lactation Consultation Note  Patient Name: Kelly Moses [id number] Date: 05/09/2021 Reason for consult: Follow-up assessment;Other (Comment) (LC into see mom and baby / MBURN doing her assessment and D/C paperwork . LC will F/U.) Age:37 hours  Maternal Data    Feeding Mother's Current Feeding Choice: Breast Milk  LATCH Score                    Lactation Tools Discussed/Used    Interventions    Discharge    Consult Status Consult Status: Follow-up Date: 05/09/21 Follow-up type: In-patient    Matilde Sprang Kedarius Aloisi 05/09/2021, 10:17 AM

## 2021-05-09 NOTE — Lactation Note (Signed)
This note was copied from a baby's chart. Lactation Consultation Note  Patient Name: Kelly Moses [id number]'D Date: 05/09/2021 Reason for consult: Follow-up assessment;Early term 37-38.6wks;Infant weight loss;Other (Comment);Nipple pain/trauma (7 % weight loss/ baby and mom has a D/C.) Age:35 years Per mom having some soreness and plans to use nipple cream.  LC recommended prior to latch if soreness - breast massage, hand express, pre-pump, reverse pressure and latch with firm support.  Baby hungry and latch 8 . See below for D/C teaching.  Mom has the [facility] brochure with resources.   Maternal Data    Feeding Mother's Current Feeding Choice: Breast Milk  LATCH Score Latch: Grasps breast easily, tongue down, lips flanged, rhythmical sucking.  Audible Swallowing: Spontaneous and intermittent  Type of Nipple: Everted at rest and after stimulation  Comfort (Breast/Nipple): Filling, red/small blisters or bruises, mild/mod discomfort  Hold (Positioning): Assistance needed to correctly position infant at breast and maintain latch.  LATCH Score: 8   Lactation Tools Discussed/Used Tools: Shells;Pump;Flanges;Comfort gels Flange Size: 24;27 Breast pump type: Manual Pump Education: Milk Storage;Setup, frequency, and cleaning Reason for Pumping: LC recommended if sorness increases- prior to latch - breast massage, hand express, pre-pump with the hand/ latch with firm support  Interventions Interventions: Breast feeding basics reviewed;Assisted with latch;Skin to skin;Breast massage;Hand express;Adjust position;Support pillows;Position options;Shells;Comfort gels;Education;Hand pump  Discharge Discharge Education: Engorgement and breast care Pump: Personal;Manual;DEBP  Consult Status Consult Status: Complete Date: 05/09/21 Follow-up type: In-patient    Kelly Moses Kelly Moses 05/09/2021, 11:29 AM

## 2021-05-17 ENCOUNTER — Telehealth (HOSPITAL_COMMUNITY): Payer: Self-pay | Admitting: *Deleted

## 2021-05-17 NOTE — Telephone Encounter (Signed)
Left message to return nurse call. Duffy Rhody, RN 05/17/2021 at 10:24am

## 2022-01-27 ENCOUNTER — Encounter (HOSPITAL_COMMUNITY): Payer: Self-pay | Admitting: *Deleted

## 2022-01-27 ENCOUNTER — Inpatient Hospital Stay (HOSPITAL_COMMUNITY)
Admission: AD | Admit: 2022-01-27 | Discharge: 2022-01-27 | Disposition: A | Discharge status: Home / Self Care | Payer: Medicaid Other | Attending: Obstetrics and Gynecology | Admitting: Obstetrics and Gynecology

## 2022-01-27 ENCOUNTER — Inpatient Hospital Stay (HOSPITAL_COMMUNITY): Payer: Medicaid Other

## 2022-01-27 DIAGNOSIS — O99891 Other specified diseases and conditions complicating pregnancy: Secondary | ICD-10-CM | POA: Diagnosis not present

## 2022-01-27 DIAGNOSIS — R12 Heartburn: Secondary | ICD-10-CM | POA: Insufficient documentation

## 2022-01-27 DIAGNOSIS — O26831 Pregnancy related renal disease, first trimester: Secondary | ICD-10-CM | POA: Diagnosis not present

## 2022-01-27 DIAGNOSIS — O26891 Other specified pregnancy related conditions, first trimester: Secondary | ICD-10-CM | POA: Diagnosis present

## 2022-01-27 DIAGNOSIS — Z3491 Encounter for supervision of normal pregnancy, unspecified, first trimester: Secondary | ICD-10-CM | POA: Diagnosis not present

## 2022-01-27 DIAGNOSIS — Z3A01 Less than 8 weeks gestation of pregnancy: Secondary | ICD-10-CM | POA: Diagnosis not present

## 2022-01-27 DIAGNOSIS — N200 Calculus of kidney: Secondary | ICD-10-CM | POA: Insufficient documentation

## 2022-01-27 LAB — WET PREP, GENITAL
Sperm: NONE SEEN
Trich, Wet Prep: NONE SEEN
WBC, Wet Prep HPF POC: <10 (ref <10)
Yeast Wet Prep HPF POC: NONE SEEN

## 2022-01-27 LAB — COMPREHENSIVE METABOLIC PANEL
ALT: 15 U/L (ref 0–44)
AST: 16 U/L (ref 15–41)
Albumin: 3.7 g/dL (ref 3.5–5.0)
Alkaline Phosphatase: 53 U/L (ref 38–126)
Anion gap: 6 (ref 5–15)
BUN: 8 mg/dL (ref 6–20)
CO2: 26 mmol/L (ref 22–32)
Calcium: 9 mg/dL (ref 8.9–10.3)
Chloride: 106 mmol/L (ref 98–111)
Creatinine, Ser: 0.67 mg/dL (ref 0.44–1.00)
GFR, Estimated: >60 mL/min (ref >60)
Glucose, Bld: 101 mg/dL — ABNORMAL HIGH (ref 70–99)
Potassium: 4.2 mmol/L (ref 3.5–5.1)
Sodium: 138 mmol/L (ref 135–145)
Total Bilirubin: 0.4 mg/dL (ref 0.3–1.2)
Total Protein: 6.6 g/dL (ref 6.5–8.1)

## 2022-01-27 LAB — CBC
HCT: 37.9 % (ref 36.0–46.0)
Hemoglobin: 12.6 g/dL (ref 12.0–15.0)
MCH: 28.8 pg (ref 26.0–34.0)
MCHC: 33.2 g/dL (ref 30.0–36.0)
MCV: 86.5 fL (ref 80.0–100.0)
Platelets: 285 10*3/uL (ref 150–400)
RBC: 4.38 MIL/uL (ref 3.87–5.11)
RDW: 11.9 % (ref 11.5–15.5)
WBC: 10.3 10*3/uL (ref 4.0–10.5)
nRBC: 0 % (ref 0.0–0.2)

## 2022-01-27 LAB — URINALYSIS, ROUTINE W REFLEX MICROSCOPIC
Bilirubin Urine: NEGATIVE
Glucose, UA: NEGATIVE mg/dL
Ketones, ur: NEGATIVE mg/dL
Leukocytes,Ua: NEGATIVE
Nitrite: NEGATIVE
Protein, ur: 30 mg/dL — AB
RBC / HPF: >50 RBC/hpf — ABNORMAL HIGH (ref 0–5)
Specific Gravity, Urine: 1.017 (ref 1.005–1.030)
pH: 6 (ref 5.0–8.0)

## 2022-01-27 LAB — LIPASE, BLOOD: Lipase: 34 U/L (ref 11–51)

## 2022-01-27 LAB — POCT PREGNANCY, URINE: Preg Test, Ur: POSITIVE — AB

## 2022-01-27 LAB — HCG, QUANTITATIVE, PREGNANCY: hCG, Beta Chain, Quant, S: 56173 m[IU]/mL — ABNORMAL HIGH (ref <5)

## 2022-01-27 NOTE — Discharge Instructions (Signed)

## 2022-01-27 NOTE — MAU Note (Signed)
Kelly Moses is a 36 y.o. at Unknown here in MAU reporting: is pregnant, has been having this constant pain in mid rt back, sometimes comes around to stomach.  Has gotten to where she can't function.  Is also spotting-started a wk ago. Has not been seen yet, has appt at end of the month.  ?LMP: 1/31 ?Onset of complaint: started last year in June, after delivery.  Recently it has gotten more intense.  ?Pain score: 2, was a 7 last night ?Vitals:  ? 01/27/22 0937  ?BP: 126/79  ?Pulse: 97  ?Resp: 18  ?Temp: 97.6 ?F (36.4 ?C)  ?SpO2: 100%  ?   ? ?Lab orders placed from triage:  urine preg, UA if + ?

## 2022-01-27 NOTE — MAU Provider Note (Signed)
?History  ?  ? ?[id number] ? ?Arrival date and time: 01/27/22 5093 ?  ? ?Chief Complaint  ?Patient presents with  ? Back Pain  ? Possible Pregnancy  ? ? ? ?HPI ?Kelly Moses is a 36 y.o. at [redacted]w[redacted]d by LMP with PMHx notable for hypertension, who presents for back & abdominal pain. ?Reports right mid back/flank pain that initially started last June after having her baby. Pain has been intermittent but worsened in the last 2 weeks. Last night had episode of pain that was 7/10. Has been treating with heat, which sometimes helps. Also has intermittent RUQ/epigastric pain that feels like indigestion. Endorses nausea with these symptoms. Hasn't treated.  ?Denies fever, lower abdominal pain, dysuria, vomiting, hematuria, or vaginal discharge. Has had intermittent pink spotting for the last 2 weeks.  ?Currently rates back pain 2/10.  ?Goes to [facility] - first appointment for this pregnancy is later this month.  ? ?OB History   ? ? Gravida  ?3  ? Para  ?2  ? Term  ?2  ? Preterm  ?   ? AB  ?   ? Living  ?2  ?  ? ? SAB  ?   ? IAB  ?   ? Ectopic  ?   ? Multiple  ?0  ? Live Births  ?2  ?   ?  ?  ? ? ?Past Medical History:  ?Diagnosis Date  ? Alcohol abuse   ? 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 No other drug use  ? Hypertension   ? ? ?Past Surgical History:  ?Procedure Laterality Date  ? CESAREAN SECTION N/A 05/07/2021  ? Procedure: CESAREAN SECTION;  Surgeon: Rande Brunt, MD;  Location: [facility] LD ORS;  Service: Obstetrics;  Laterality: N/A;  ? WISDOM TOOTH EXTRACTION    ? ? ?Family History  ?Problem Relation Age of Onset  ? Diabetes Mother   ? Kidney disease Father   ? Heart disease Father   ?     murmur  ? Hypertension Father   ? Obesity Father   ? Cancer Maternal Grandmother   ? Cancer Maternal Grandfather   ? ? ?Allergies  ?Allergen Reactions  ? Sulfa Antibiotics Rash  ? ? ?No current facility-administered medications on file prior to encounter.  ? ?Current Outpatient Medications on  File Prior to Encounter  ?Medication Sig Dispense Refill  ? acetaminophen (TYLENOL) 500 MG tablet Take 1,000 mg by mouth every 6 (six) hours as needed.    ? Prenatal Vit-Fe Fumarate-FA (PRENATAL MULTIVITAMIN) TABS tablet Take 1 tablet by mouth daily at 12 noon.    ? traZODone (DESYREL) 50 MG tablet Take 50 mg by mouth at bedtime.    ? ? ? ?ROS ?Pertinent positives and negative per HPI, all others reviewed and negative ? ?Physical Exam  ? ?BP 129/90 (BP Location: Left Arm)   Pulse 88   Temp 97.6 ?F (36.4 ?C) (Oral)   Resp 18   Ht 5\' 3"  (1.6 m)   Wt 63.4 kg   LMP 12/10/2021   SpO2 100%   BMI 24.76 kg/m?  ? ?Patient Vitals for the past 24 hrs: ? BP Temp Temp src Pulse Resp SpO2 Height Weight  ?01/27/22 1408 129/90 -- -- 88 18 -- -- --  ?01/27/22 0937 126/79 97.6 ?F (36.4 ?C) Oral 97 18 100 % 5\' 3"  (1.6 m) 63.4 kg  ? ? ?Physical Exam ?Vitals and nursing note reviewed.  ?Constitutional:   ?  General: She is not in acute distress. ?   Appearance: Normal appearance. She is not ill-appearing.  ?HENT:  ?   Head: Normocephalic and atraumatic.  ?Eyes:  ?   General: No scleral icterus. ?   Conjunctiva/sclera: Conjunctivae normal.  ?Pulmonary:  ?   Effort: Pulmonary effort is normal. No respiratory distress.  ?Abdominal:  ?   Palpations: Abdomen is soft.  ?   Tenderness: There is no abdominal tenderness. There is no right CVA tenderness or left CVA tenderness.  ?Skin: ?   General: Skin is warm and dry.  ?Neurological:  ?   Mental Status: She is alert.  ?Psychiatric:     ?   Mood and Affect: Mood normal.     ?   Behavior: Behavior normal.  ?  ? ? ?Labs ?Results for orders placed or performed during the hospital encounter of 01/27/22 (from the past 24 hour(s))  ?Pregnancy, urine POC     Status: Abnormal  ? Collection Time: 01/27/22  9:46 AM  ?Result Value Ref Range  ? Preg Test, Ur POSITIVE (A) NEGATIVE  ?Urinalysis, Routine w reflex microscopic Urine, Clean Catch     Status: Abnormal  ? Collection Time: 01/27/22  9:47 AM   ?Result Value Ref Range  ? Color, Urine YELLOW YELLOW  ? APPearance HAZY (A) CLEAR  ? Specific Gravity, Urine 1.017 1.005 - 1.030  ? pH 6.0 5.0 - 8.0  ? Glucose, UA NEGATIVE NEGATIVE mg/dL  ? Hgb urine dipstick LARGE (A) NEGATIVE  ? Bilirubin Urine NEGATIVE NEGATIVE  ? Ketones, ur NEGATIVE NEGATIVE mg/dL  ? Protein, ur 30 (A) NEGATIVE mg/dL  ? Nitrite NEGATIVE NEGATIVE  ? Leukocytes,Ua NEGATIVE NEGATIVE  ? RBC / HPF >50 (H) 0 - 5 RBC/hpf  ? WBC, UA 0-5 0 - 5 WBC/hpf  ? Bacteria, UA FEW (A) NONE SEEN  ? Squamous Epithelial / LPF 0-5 0 - 5  ? Mucus PRESENT   ?CBC     Status: None  ? Collection Time: 01/27/22 10:38 AM  ?Result Value Ref Range  ? WBC 10.3 4.0 - 10.5 K/uL  ? RBC 4.38 3.87 - 5.11 MIL/uL  ? Hemoglobin 12.6 12.0 - 15.0 g/dL  ? HCT 37.9 36.0 - 46.0 %  ? MCV 86.5 80.0 - 100.0 fL  ? MCH 28.8 26.0 - 34.0 pg  ? MCHC 33.2 30.0 - 36.0 g/dL  ? RDW 11.9 11.5 - 15.5 %  ? Platelets 285 150 - 400 K/uL  ? nRBC 0.0 0.0 - 0.2 %  ?Comprehensive metabolic panel     Status: Abnormal  ? Collection Time: 01/27/22 10:38 AM  ?Result Value Ref Range  ? Sodium 138 135 - 145 mmol/L  ? Potassium 4.2 3.5 - 5.1 mmol/L  ? Chloride 106 98 - 111 mmol/L  ? CO2 26 22 - 32 mmol/L  ? Glucose, Bld 101 (H) 70 - 99 mg/dL  ? BUN 8 6 - 20 mg/dL  ? Creatinine, Ser 0.67 0.44 - 1.00 mg/dL  ? Calcium 9.0 8.9 - 10.3 mg/dL  ? Total Protein 6.6 6.5 - 8.1 g/dL  ? Albumin 3.7 3.5 - 5.0 g/dL  ? AST 16 15 - 41 U/L  ? ALT 15 0 - 44 U/L  ? Alkaline Phosphatase 53 38 - 126 U/L  ? Total Bilirubin 0.4 0.3 - 1.2 mg/dL  ? GFR, Estimated >60 >60 mL/min  ? Anion gap 6 5 - 15  ?Lipase, blood     Status: None  ? Collection  Time: 01/27/22 10:38 AM  ?Result Value Ref Range  ? Lipase 34 11 - 51 U/L  ?hCG, quantitative, pregnancy     Status: Abnormal  ? Collection Time: 01/27/22 10:38 AM  ?Result Value Ref Range  ? hCG, Beta Chain, Quant, S 56,173 (H) <5 mIU/mL  ?Wet prep, genital     Status: Abnormal  ? Collection Time: 01/27/22 11:06 AM  ? Specimen: Vaginal  ?Result  Value Ref Range  ? Yeast Wet Prep HPF POC NONE SEEN NONE SEEN  ? Trich, Wet Prep NONE SEEN NONE SEEN  ? Clue Cells Wet Prep HPF POC PRESENT (A) NONE SEEN  ? WBC, Wet Prep HPF POC <10 <10  ? Sperm NONE SEEN   ? ? ?Imaging ?US RENAL ? ?Result Date: 01/27/2022 ?CLINICAL DATA:  Right flank pain. EXAM: RENAL / URINARY TRACT ULTRASOUND COMPLETE COMPARISON:  None. FINDINGS: Right Kidney: Renal measurements: 11.6 x 5.4 x 4.8 cm = volume: 157 mL. Echogenicity within normal limits. Probable nonobstructive 4 mm upper pole renal stone. No mass or hydronephrosis visualized. Left Kidney: Renal measurements: 11 x 5.7 x 4.3 cm = volume: 142 mL. Echogenicity within normal limits. No mass or hydronephrosis visualized. Bladder: Appears normal for degree of bladder distention. Other: None. IMPRESSION: 1. No hydronephrosis. 2. Probable 4 mm nonobstructive right renal stone. Electronically Signed   By: Maudry MayhewJeffrey  Waltz M.D.   On: 01/27/2022 13:15  ? ?US OB LESS THAN 14 WEEKS WITH OB TRANSVAGINAL ? ?Result Date: 01/27/2022 ?CLINICAL DATA:  Vaginal spotting for 1 week. EXAM: OBSTETRIC <14 WK ULTRASOUND TECHNIQUE: Transabdominal ultrasound was performed for evaluation of the gestation as well as the maternal uterus and adnexal regions. COMPARISON:  None. FINDINGS: Intrauterine gestational sac: Single. Yolk sac:  Present. Embryo:  Present. Cardiac Activity: Present. Heart Rate: 96 bpm CRL:   5.8 mm   6 w 2 d                  US EDC: 09/20/2022 Subchorionic hemorrhage:  None visualized. Maternal uterus/adnexae: Ovaries are unremarkable.  No free fluid. IMPRESSION: Single living intrauterine pregnancy with gestational age [redacted] weeks 2 days, estimated date of confinement 09/20/2022. Electronically Signed   By: Leanna BattlesMelinda  Blietz M.D.   On: 01/27/2022 13:11   ? ?MAU Course  ?Procedures ?Lab Orders    ?     Wet prep, genital    ?     Culture, OB Urine    ?     Urinalysis, Routine w reflex microscopic Urine, Clean Catch    ?     CBC    ?     Comprehensive  metabolic panel    ?     Lipase, blood    ?     hCG, quantitative, pregnancy    ?     Pregnancy, urine POC    ?No orders of the defined types were placed in this encounter. ? ?Imaging Orders    ?     US OB LESS

## 2022-01-28 LAB — GC/CHLAMYDIA PROBE AMP (~~LOC~~) NOT AT ARMC
Chlamydia: NEGATIVE
Comment: NEGATIVE
Comment: NORMAL
Neisseria Gonorrhea: NEGATIVE

## 2022-01-29 LAB — CULTURE, OB URINE
Culture: >=100000 — AB
Special Requests: NORMAL

## 2022-03-22 IMAGING — US US RENAL
1 series · 15 of 25 positions shown · non-contrast
Comparison: None.

CLINICAL DATA: Right flank pain.

EXAM:
RENAL / URINARY TRACT ULTRASOUND COMPLETE

[Series 1: us renal · 15 of 45 slices shown]
[im 1/45]
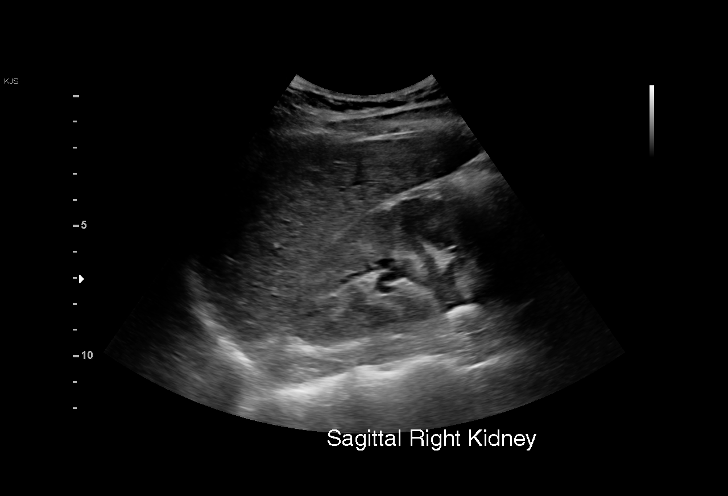
[im 4/45]
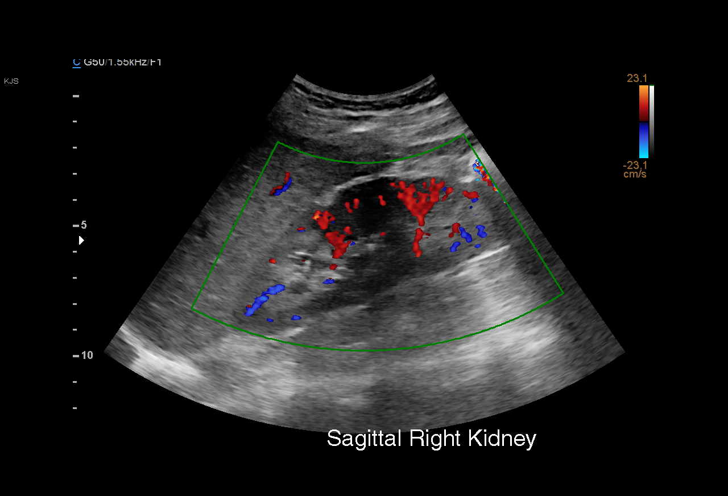
[im 8/45]
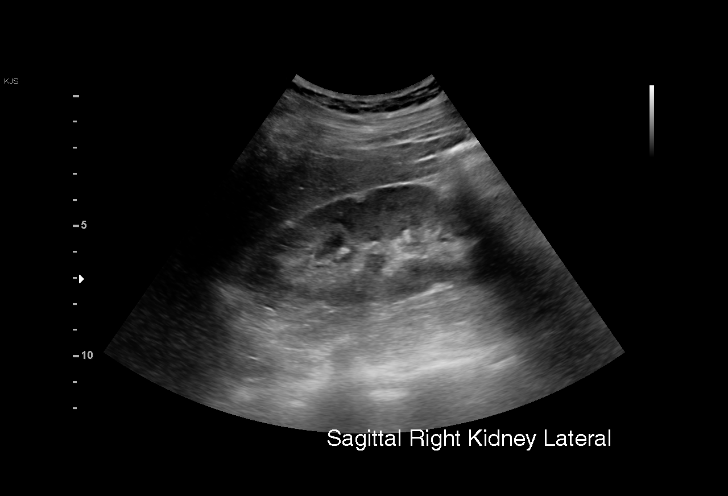
[im 10/45]
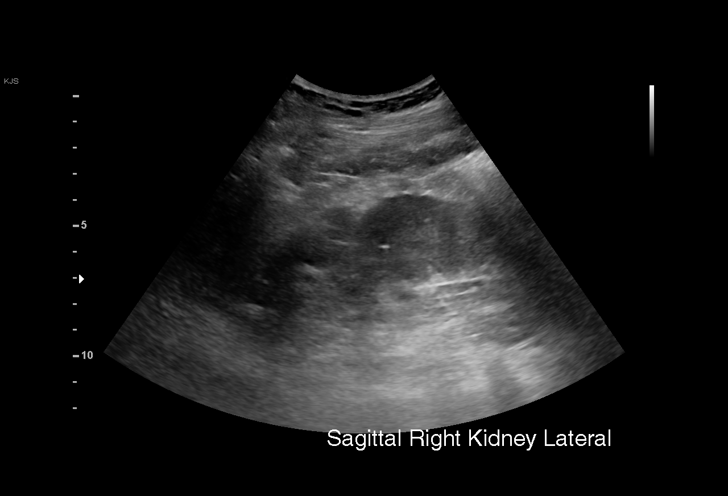
[im 13/45]
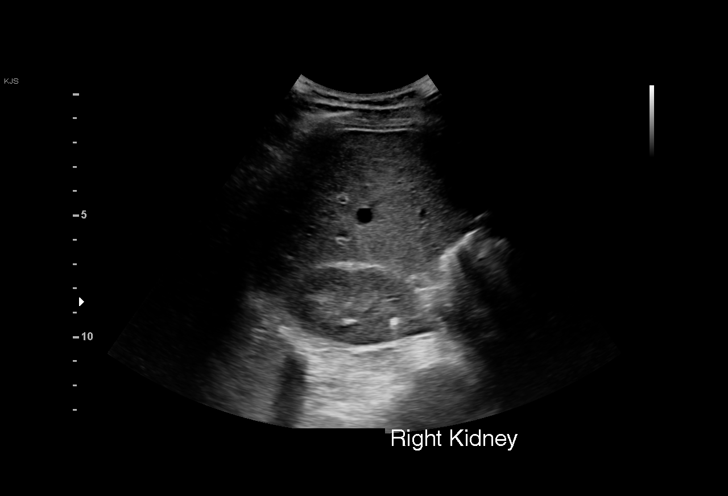
[im 17/45]
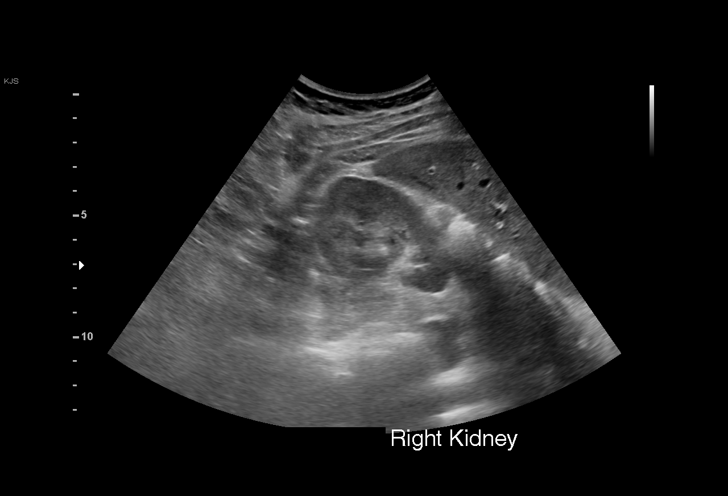
[im 19/45]
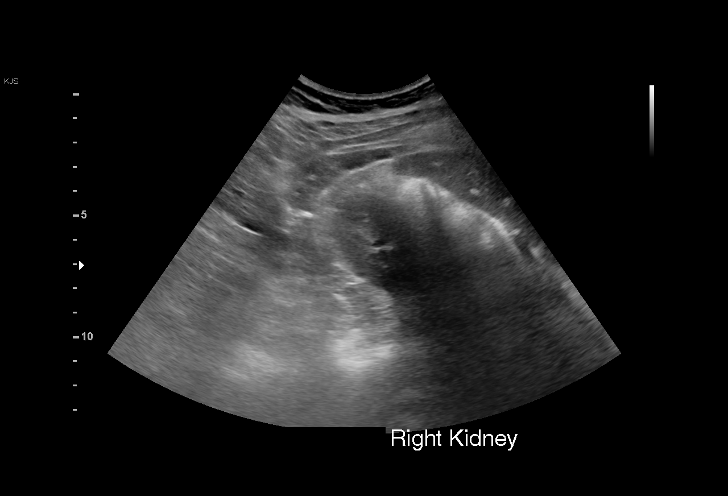
[im 23/45]
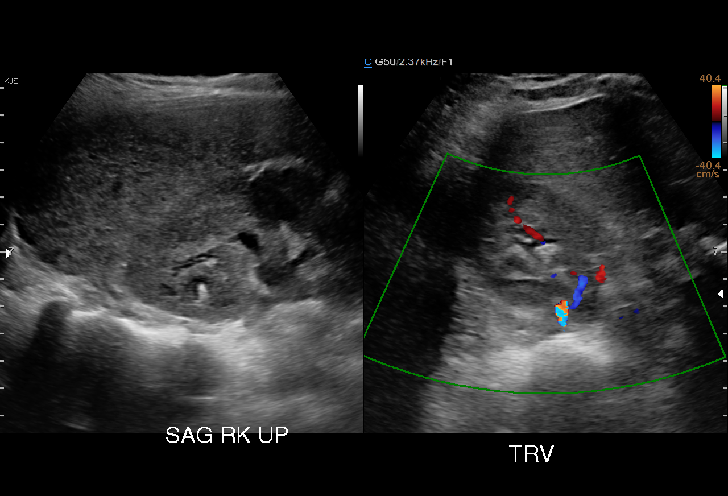
[im 26/45]
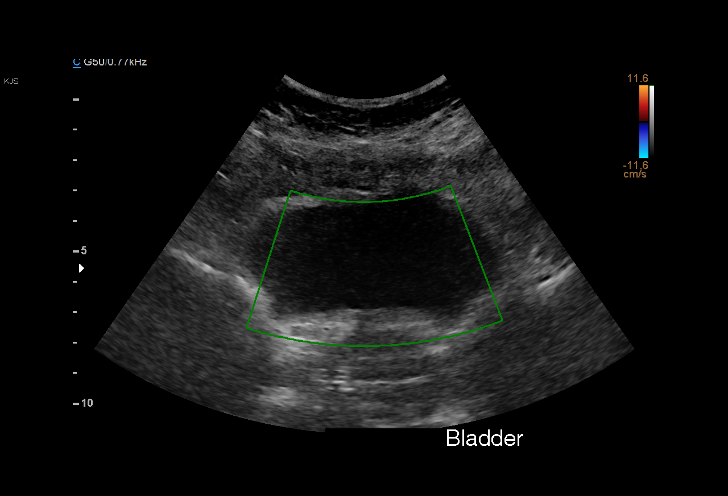
[im 28/45]
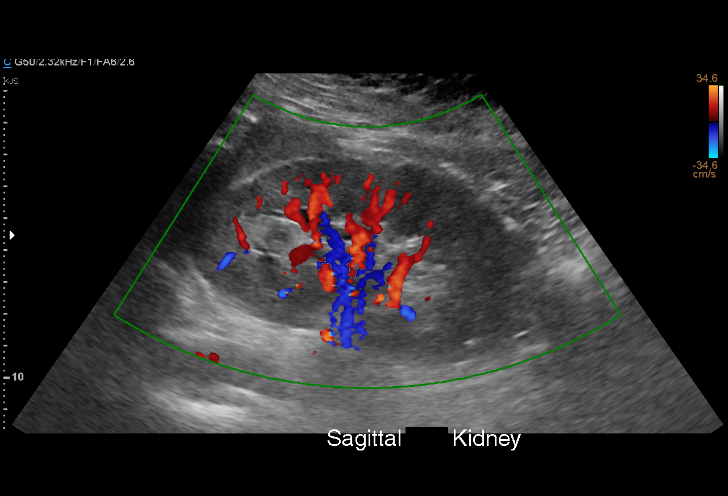
[im 32/45]
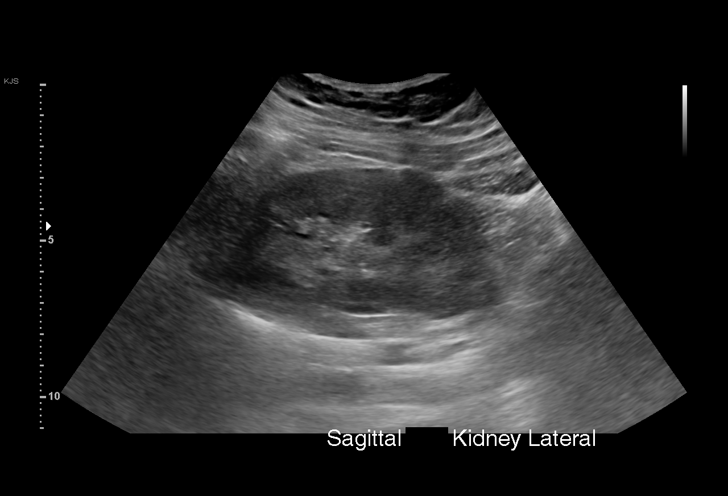
[im 35/45]
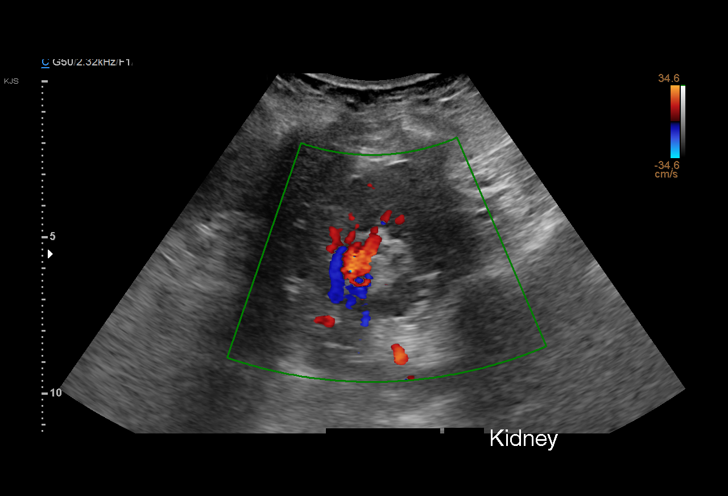
[im 37/45]
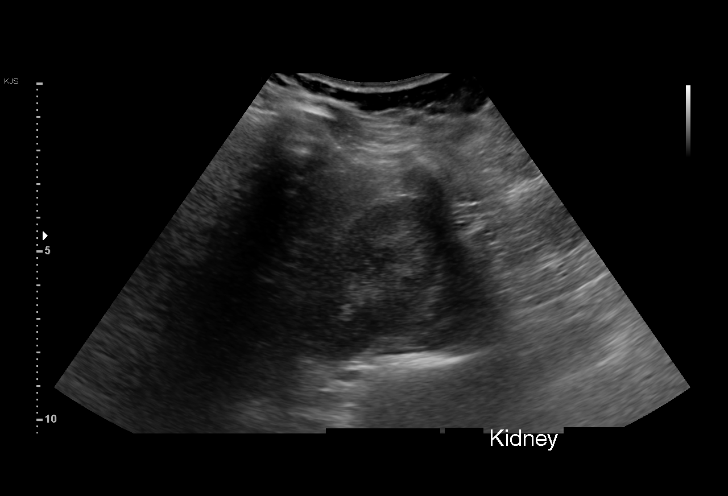
[im 41/45]
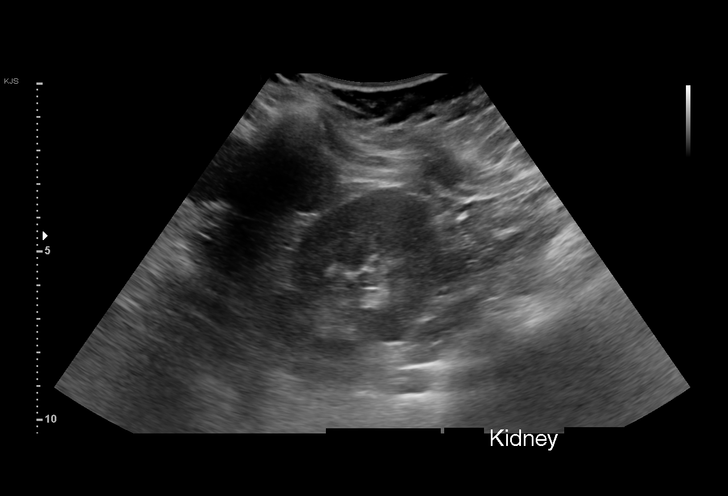
[im 45/45]
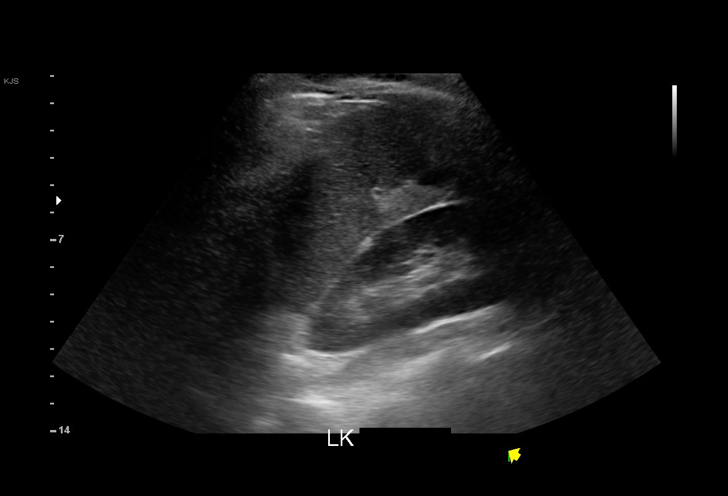

[15 of 25 positions shown; findings below may reference images not displayed]

FINDINGS: Right Kidney:

Renal measurements: 11.6 x 5.4 x 4.8 cm = volume: 157 mL.
Echogenicity within normal limits. Probable nonobstructive 4 mm
upper pole renal stone. No mass or hydronephrosis visualized.

Left Kidney:

Renal measurements: 11 x 5.7 x 4.3 cm = volume: 142 mL. Echogenicity
within normal limits. No mass or hydronephrosis visualized.

Bladder:

Appears normal for degree of bladder distention.

Other:

None.
IMPRESSION: 1. No hydronephrosis.
2. Probable 4 mm nonobstructive right renal stone.

## 2022-05-09 ENCOUNTER — Other Ambulatory Visit: Payer: Self-pay | Admitting: Obstetrics and Gynecology

## 2022-05-09 ENCOUNTER — Other Ambulatory Visit: Payer: Self-pay

## 2022-05-09 DIAGNOSIS — Z363 Encounter for antenatal screening for malformations: Secondary | ICD-10-CM

## 2022-05-26 ENCOUNTER — Ambulatory Visit: Payer: Medicaid Other

## 2022-05-26 ENCOUNTER — Ambulatory Visit: Payer: Medicaid Other | Admitting: *Deleted

## 2022-05-26 ENCOUNTER — Other Ambulatory Visit: Payer: Self-pay | Admitting: Obstetrics and Gynecology

## 2022-05-26 ENCOUNTER — Other Ambulatory Visit: Payer: Self-pay | Admitting: *Deleted

## 2022-05-26 ENCOUNTER — Encounter: Payer: Self-pay | Admitting: *Deleted

## 2022-05-26 ENCOUNTER — Ambulatory Visit: Payer: Medicaid Other | Attending: Obstetrics and Gynecology

## 2022-05-26 VITALS — BP 111/74 | HR 81

## 2022-05-26 DIAGNOSIS — Z363 Encounter for antenatal screening for malformations: Secondary | ICD-10-CM | POA: Diagnosis not present

## 2022-05-26 DIAGNOSIS — O35HXX0 Maternal care for other (suspected) fetal abnormality and damage, fetal lower extremities anomalies, not applicable or unspecified: Secondary | ICD-10-CM | POA: Diagnosis not present

## 2022-05-26 DIAGNOSIS — Z882 Allergy status to sulfonamides status: Secondary | ICD-10-CM | POA: Diagnosis not present

## 2022-05-26 DIAGNOSIS — O10912 Unspecified pre-existing hypertension complicating pregnancy, second trimester: Secondary | ICD-10-CM

## 2022-05-26 DIAGNOSIS — O34219 Maternal care for unspecified type scar from previous cesarean delivery: Secondary | ICD-10-CM | POA: Diagnosis not present

## 2022-05-26 DIAGNOSIS — O09522 Supervision of elderly multigravida, second trimester: Secondary | ICD-10-CM | POA: Insufficient documentation

## 2022-05-26 DIAGNOSIS — O10012 Pre-existing essential hypertension complicating pregnancy, second trimester: Secondary | ICD-10-CM | POA: Diagnosis not present

## 2022-05-26 DIAGNOSIS — O283 Abnormal ultrasonic finding on antenatal screening of mother: Secondary | ICD-10-CM

## 2022-05-26 DIAGNOSIS — Z3A23 23 weeks gestation of pregnancy: Secondary | ICD-10-CM | POA: Diagnosis not present

## 2022-05-26 DIAGNOSIS — O35BXX0 Maternal care for other (suspected) fetal abnormality and damage, fetal cardiac anomalies, not applicable or unspecified: Secondary | ICD-10-CM

## 2022-05-26 DIAGNOSIS — O10919 Unspecified pre-existing hypertension complicating pregnancy, unspecified trimester: Secondary | ICD-10-CM

## 2022-05-26 NOTE — Addendum Note (Signed)
Addended by: Teena Dunk on: 05/26/2022 11:10 AM   Modules accepted: Orders

## 2022-06-04 ENCOUNTER — Telehealth: Payer: Self-pay | Admitting: Genetics

## 2022-06-04 NOTE — Telephone Encounter (Signed)
Called [facility] to return normal amniocentesis karyotype result. Left voicemail with [facility] call back number.

## 2022-06-06 LAB — CHROMOSOME, AMNIOTIC FLUID
Cells Analyzed: 15
Cells Counted: 15
Cells Karyotyped: 2
Colonies: 15
GTG Band Resolution Achieved: 450

## 2022-06-06 LAB — MATERNAL CELL CONTAMINATION

## 2022-06-06 LAB — DIRECT PRENATAL SNP CMA

## 2022-06-10 ENCOUNTER — Telehealth: Payer: Self-pay | Admitting: Obstetrics and Gynecology

## 2022-06-10 NOTE — Telephone Encounter (Signed)
I called Ms. Nokes to discuss her negative chromosomal microarray results from amniocentesis. Microarray analysis revealed a normal female complement with no significant DNA copy number changes or copy neutral regions and no maternal cell contamination. This significantly reduces the possibility of a chromosomal microdeletion or microduplication syndrome in this fetus. While karyotype and chromosomal microarray cannot rule out all genetic syndromes, normal karyotype and chromosomal microarray analyses for the current fetus rule out clinically significant chromosomal abnormalities. Ms. Kelsay confirmed that she had no further questions at this time.   Katrina Stack, MS, [facility] Genetic Counselor

## 2022-06-16 ENCOUNTER — Telehealth: Payer: Self-pay | Admitting: Genetics

## 2022-06-16 NOTE — Telephone Encounter (Signed)
Called [facility] to follow-up after amniocentesis results were returned. Specifically called to discuss the option of whole exome sequencing on the amniotic fluid. Left voicemail with [facility] call back number.

## 2022-06-19 ENCOUNTER — Other Ambulatory Visit: Payer: Self-pay | Admitting: *Deleted

## 2022-06-19 ENCOUNTER — Other Ambulatory Visit: Payer: Medicaid Other

## 2022-06-19 ENCOUNTER — Other Ambulatory Visit: Payer: Self-pay | Admitting: Obstetrics and Gynecology

## 2022-06-19 ENCOUNTER — Ambulatory Visit: Payer: Medicaid Other | Attending: Obstetrics and Gynecology

## 2022-06-19 ENCOUNTER — Ambulatory Visit (HOSPITAL_BASED_OUTPATIENT_CLINIC_OR_DEPARTMENT_OTHER): Payer: Medicaid Other | Admitting: Obstetrics

## 2022-06-19 ENCOUNTER — Encounter: Payer: Self-pay | Admitting: *Deleted

## 2022-06-19 ENCOUNTER — Ambulatory Visit: Payer: Medicaid Other | Admitting: *Deleted

## 2022-06-19 VITALS — BP 128/84 | HR 93

## 2022-06-19 DIAGNOSIS — O3500X0 Maternal care for (suspected) central nervous system malformation or damage in fetus, unspecified, not applicable or unspecified: Secondary | ICD-10-CM

## 2022-06-19 DIAGNOSIS — O365920 Maternal care for other known or suspected poor fetal growth, second trimester, not applicable or unspecified: Secondary | ICD-10-CM

## 2022-06-19 DIAGNOSIS — Q043 Other reduction deformities of brain: Secondary | ICD-10-CM

## 2022-06-19 DIAGNOSIS — O34219 Maternal care for unspecified type scar from previous cesarean delivery: Secondary | ICD-10-CM

## 2022-06-19 DIAGNOSIS — O283 Abnormal ultrasonic finding on antenatal screening of mother: Secondary | ICD-10-CM | POA: Diagnosis present

## 2022-06-19 DIAGNOSIS — O10012 Pre-existing essential hypertension complicating pregnancy, second trimester: Secondary | ICD-10-CM

## 2022-06-19 DIAGNOSIS — O09522 Supervision of elderly multigravida, second trimester: Secondary | ICD-10-CM

## 2022-06-19 DIAGNOSIS — O365930 Maternal care for other known or suspected poor fetal growth, third trimester, not applicable or unspecified: Secondary | ICD-10-CM | POA: Diagnosis not present

## 2022-06-19 DIAGNOSIS — O35FXX0 Maternal care for other (suspected) fetal abnormality and damage, fetal musculoskeletal anomalies of trunk, not applicable or unspecified: Secondary | ICD-10-CM

## 2022-06-19 DIAGNOSIS — O35BXX0 Maternal care for other (suspected) fetal abnormality and damage, fetal cardiac anomalies, not applicable or unspecified: Secondary | ICD-10-CM

## 2022-06-19 DIAGNOSIS — O10912 Unspecified pre-existing hypertension complicating pregnancy, second trimester: Secondary | ICD-10-CM

## 2022-06-19 DIAGNOSIS — Z3689 Encounter for other specified antenatal screening: Secondary | ICD-10-CM

## 2022-06-19 DIAGNOSIS — Z362 Encounter for other antenatal screening follow-up: Secondary | ICD-10-CM

## 2022-06-19 DIAGNOSIS — O10919 Unspecified pre-existing hypertension complicating pregnancy, unspecified trimester: Secondary | ICD-10-CM | POA: Insufficient documentation

## 2022-06-19 DIAGNOSIS — Q750 Craniosynostosis: Secondary | ICD-10-CM

## 2022-06-19 DIAGNOSIS — Z3A27 27 weeks gestation of pregnancy: Secondary | ICD-10-CM

## 2022-06-19 DIAGNOSIS — Q75009 Craniosynostosis, unspecified: Secondary | ICD-10-CM

## 2022-06-19 NOTE — Progress Notes (Signed)
MFM Note  Kelly Moses was seen for a follow up exam due to IUGR and advanced maternal age.  A possible cardiac defect was noted on her last ultrasound exam.  The patient had a fetal echocardiogram performed with [facility] pediatric cardiology that showed a VSD.  Dr. Casilda Carls the pediatric cardiologist, rated the quality of that exam as poor.  She had an amniocentesis that showed normal 9, XY chromosomes with a normal MicroArray test.   She denies any problems since her last exam and reports feeling vigorous fetal movements throughout the day.    On today's exam, the EFW (1lb 10 oz) measures at the less than the 1st percentile for her gestational age, indicating severe IUGR.  The fetus grew about 9 ounces over the past 3 weeks.  There was normal amniotic fluid noted.    Fetal movements were noted throughout today's exam.  Doppler studies of the umbilical arteries showed a normal S/D ratio of 2.68.  There were no signs of absent or reversed end-diastolic flow.    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  The patient and her partner were advised that craniosynostosis is a disorder where the cranial sutures fuse before the fetal head is fully formed.  The fetal brain continues to grow, giving the head a missshapen appearance.  Craniosynostosis may be repaired with surgery, usually with good outcomes.  The significance of the hypoplasia of the left side of the cerebellum remains unknown.  She was advised that this may be a normal variant.  A fetal MRI may be considered for better assessment of the fetal brain.  They were advised that  tetralogy of Fallot is one of the more common congenital heart defects.  The prognosis is usually good following surgical repair.  I will send today's ultrasound pictures to Dr. Casilda Carls at [facility].  The patient already has a follow-up fetal echocardiogram scheduled at [facility] on June 30, 2022.    Should tetralogy of Fallot be confirmed, the patient should be able to deliver at [facility] as surgical repair is usually performed within the few months of life.  The increased risk of an adverse fetal outcomes such as a fetal demise due to the abnormalities noted today and severe IUGR was discussed.  Due to fetal growth restriction, we will continue to follow her closely with fetal testing and umbilical artery Doppler studies.    They understand that a premature delivery may be necessary should the fetus show absent growth, should the fetal testing be abnormal, or should the umbilical artery Doppler studies show absent or reversed end-diastolic flow.  The patient and her partner understand that the final prognosis for her pregnancy will not be determined until after the baby is born.  An NST and umbilical artery Doppler study was scheduled in our office in 2 weeks.  The couple stated that all of their questions were answered today.  A total of 45 minutes was spent counseling and coordinating the care for this patient.  Greater than 50% of the time was spent in direct face-to-face contact.

## 2022-07-03 ENCOUNTER — Ambulatory Visit: Payer: Medicaid Other | Attending: Obstetrics and Gynecology

## 2022-07-03 ENCOUNTER — Ambulatory Visit (HOSPITAL_BASED_OUTPATIENT_CLINIC_OR_DEPARTMENT_OTHER): Payer: Medicaid Other | Admitting: *Deleted

## 2022-07-03 ENCOUNTER — Ambulatory Visit: Payer: Medicaid Other | Admitting: *Deleted

## 2022-07-03 VITALS — BP 126/81 | HR 86

## 2022-07-03 DIAGNOSIS — O365930 Maternal care for other known or suspected poor fetal growth, third trimester, not applicable or unspecified: Secondary | ICD-10-CM

## 2022-07-03 DIAGNOSIS — Z3689 Encounter for other specified antenatal screening: Secondary | ICD-10-CM | POA: Insufficient documentation

## 2022-07-03 DIAGNOSIS — O09522 Supervision of elderly multigravida, second trimester: Secondary | ICD-10-CM | POA: Diagnosis not present

## 2022-07-03 DIAGNOSIS — O10912 Unspecified pre-existing hypertension complicating pregnancy, second trimester: Secondary | ICD-10-CM | POA: Diagnosis present

## 2022-07-03 DIAGNOSIS — O34219 Maternal care for unspecified type scar from previous cesarean delivery: Secondary | ICD-10-CM | POA: Diagnosis present

## 2022-07-03 DIAGNOSIS — Q750 Craniosynostosis: Secondary | ICD-10-CM | POA: Diagnosis not present

## 2022-07-03 DIAGNOSIS — Z3A29 29 weeks gestation of pregnancy: Secondary | ICD-10-CM | POA: Insufficient documentation

## 2022-07-03 DIAGNOSIS — O365920 Maternal care for other known or suspected poor fetal growth, second trimester, not applicable or unspecified: Secondary | ICD-10-CM | POA: Diagnosis not present

## 2022-07-03 DIAGNOSIS — Q043 Other reduction deformities of brain: Secondary | ICD-10-CM | POA: Diagnosis present

## 2022-07-03 NOTE — Procedures (Signed)
Kelly Moses [birth year] [redacted]w[redacted]d  Fetus A Non-Stress Test Interpretation for 07/03/22  Indication: IUGR, Advanced Maternal Age >40 years, and EIF  Fetal Heart Rate A Mode: External Baseline Rate (A): 140 bpm Variability: Moderate Accelerations: 15 x 15 Decelerations: None Multiple birth?: No  Uterine Activity Mode: Toco Contraction Frequency (min): none Resting Tone Palpated: Relaxed  Interpretation (Fetal Testing) Nonstress Test Interpretation: Reactive Overall Impression: Reassuring for gestational age Comments: Tracing reviewed by Dr. Judeth Cornfield

## 2022-07-10 ENCOUNTER — Ambulatory Visit (HOSPITAL_BASED_OUTPATIENT_CLINIC_OR_DEPARTMENT_OTHER): Payer: Medicaid Other | Admitting: *Deleted

## 2022-07-10 ENCOUNTER — Ambulatory Visit: Payer: Medicaid Other | Admitting: *Deleted

## 2022-07-10 ENCOUNTER — Ambulatory Visit: Payer: Medicaid Other | Attending: Obstetrics

## 2022-07-10 VITALS — BP 113/76 | HR 84

## 2022-07-10 DIAGNOSIS — O34219 Maternal care for unspecified type scar from previous cesarean delivery: Secondary | ICD-10-CM

## 2022-07-10 DIAGNOSIS — O358XX0 Maternal care for other (suspected) fetal abnormality and damage, not applicable or unspecified: Secondary | ICD-10-CM

## 2022-07-10 DIAGNOSIS — O10912 Unspecified pre-existing hypertension complicating pregnancy, second trimester: Secondary | ICD-10-CM | POA: Insufficient documentation

## 2022-07-10 DIAGNOSIS — Z3A30 30 weeks gestation of pregnancy: Secondary | ICD-10-CM

## 2022-07-10 DIAGNOSIS — O35BXX0 Maternal care for other (suspected) fetal abnormality and damage, fetal cardiac anomalies, not applicable or unspecified: Secondary | ICD-10-CM | POA: Diagnosis not present

## 2022-07-10 DIAGNOSIS — O365920 Maternal care for other known or suspected poor fetal growth, second trimester, not applicable or unspecified: Secondary | ICD-10-CM | POA: Insufficient documentation

## 2022-07-10 DIAGNOSIS — Z3689 Encounter for other specified antenatal screening: Secondary | ICD-10-CM | POA: Insufficient documentation

## 2022-07-10 DIAGNOSIS — Q043 Other reduction deformities of brain: Secondary | ICD-10-CM | POA: Insufficient documentation

## 2022-07-10 DIAGNOSIS — Q750 Craniosynostosis: Secondary | ICD-10-CM | POA: Diagnosis present

## 2022-07-10 DIAGNOSIS — O365930 Maternal care for other known or suspected poor fetal growth, third trimester, not applicable or unspecified: Secondary | ICD-10-CM

## 2022-07-10 DIAGNOSIS — O09523 Supervision of elderly multigravida, third trimester: Secondary | ICD-10-CM | POA: Diagnosis not present

## 2022-07-10 DIAGNOSIS — O09522 Supervision of elderly multigravida, second trimester: Secondary | ICD-10-CM | POA: Diagnosis present

## 2022-07-10 DIAGNOSIS — O10013 Pre-existing essential hypertension complicating pregnancy, third trimester: Secondary | ICD-10-CM

## 2022-07-10 NOTE — Procedures (Signed)
Kelly Moses 06-25-1986 [redacted]w[redacted]d  Fetus A Non-Stress Test Interpretation for 07/10/22  Indication: IUGR  Fetal Heart Rate A Mode: External Baseline Rate (A): 135 bpm Variability: Moderate Accelerations: 15 x 15 Decelerations: Variable Multiple birth?: No  Uterine Activity Mode: Palpation, Toco Contraction Frequency (min): none Resting Tone Palpated: Relaxed  Interpretation (Fetal Testing) Nonstress Test Interpretation: Reactive Overall Impression: Reassuring for gestational age Comments: Dr. Judeth Cornfield reviewed tracing

## 2022-07-17 ENCOUNTER — Ambulatory Visit: Payer: Medicaid Other

## 2022-07-17 ENCOUNTER — Other Ambulatory Visit: Payer: Medicaid Other

## 2022-07-29 ENCOUNTER — Ambulatory Visit: Payer: Medicaid Other | Admitting: *Deleted

## 2022-07-29 ENCOUNTER — Ambulatory Visit: Payer: Medicaid Other | Attending: Obstetrics and Gynecology | Admitting: *Deleted

## 2022-07-29 VITALS — BP 121/79 | HR 86

## 2022-07-29 DIAGNOSIS — O365930 Maternal care for other known or suspected poor fetal growth, third trimester, not applicable or unspecified: Secondary | ICD-10-CM

## 2022-07-29 DIAGNOSIS — O09523 Supervision of elderly multigravida, third trimester: Secondary | ICD-10-CM | POA: Insufficient documentation

## 2022-07-29 DIAGNOSIS — Z3A33 33 weeks gestation of pregnancy: Secondary | ICD-10-CM

## 2022-07-29 DIAGNOSIS — O283 Abnormal ultrasonic finding on antenatal screening of mother: Secondary | ICD-10-CM

## 2022-07-29 NOTE — Procedures (Addendum)
Tiernan Millikin January 03, 1986 [redacted]w[redacted]d  Fetus A Non-Stress Test Interpretation for 07/29/22  Indication: IUGR and AMA, Fetal VSD, fetal anomalies  Fetal Heart Rate A Mode: External Baseline Rate (A): 140 bpm Variability: Moderate Accelerations: 15 x 15 Decelerations: None Multiple birth?: No  Uterine Activity Mode: Palpation, Toco Contraction Frequency (min): None  Interpretation (Fetal Testing) Nonstress Test Interpretation: Reactive Comments: Dr. Annamaria Boots reviewed tracing.  The patient is delivering at [facility].    She is undergoing twice-weekly fetal testing.   She will have her weekly NSTs done here at [facility] on Tuesdays and will have a BPP and umbilical artery Doppler studies at [facility] on Fridays.    She will return in 1 week for another NST.

## 2022-08-04 ENCOUNTER — Encounter: Payer: Self-pay | Admitting: *Deleted

## 2022-08-04 ENCOUNTER — Ambulatory Visit: Payer: Medicaid Other | Admitting: *Deleted

## 2022-08-04 ENCOUNTER — Ambulatory Visit: Payer: Medicaid Other | Attending: Obstetrics | Admitting: *Deleted

## 2022-08-04 DIAGNOSIS — Z3A33 33 weeks gestation of pregnancy: Secondary | ICD-10-CM | POA: Diagnosis not present

## 2022-08-04 DIAGNOSIS — O365930 Maternal care for other known or suspected poor fetal growth, third trimester, not applicable or unspecified: Secondary | ICD-10-CM

## 2022-08-04 DIAGNOSIS — O35BXX0 Maternal care for other (suspected) fetal abnormality and damage, fetal cardiac anomalies, not applicable or unspecified: Secondary | ICD-10-CM

## 2022-08-04 NOTE — Procedures (Signed)
Kelly Moses August 20, 1986 [redacted]w[redacted]d  Fetus A Non-Stress Test Interpretation for 08/04/22  Indication: IUGR  Fetal Heart Rate A Mode: External Baseline Rate (A): 130 bpm Variability: Moderate Accelerations: 15 x 15 Decelerations: None Multiple birth?: No  Uterine Activity Mode: Palpation, Toco Contraction Frequency (min): none Resting Tone Palpated: Relaxed  Interpretation (Fetal Testing) Nonstress Test Interpretation: Reactive Overall Impression: Reassuring for gestational age Comments: Dr. Donalee Citrin reviewed tracing

## 2022-08-06 ENCOUNTER — Ambulatory Visit: Payer: Medicaid Other

## 2022-08-11 ENCOUNTER — Ambulatory Visit: Payer: Medicaid Other | Admitting: *Deleted

## 2022-08-11 ENCOUNTER — Other Ambulatory Visit: Payer: Medicaid Other

## 2022-08-11 ENCOUNTER — Encounter: Payer: Self-pay | Admitting: *Deleted

## 2022-08-11 ENCOUNTER — Ambulatory Visit: Payer: Medicaid Other | Attending: Obstetrics | Admitting: *Deleted

## 2022-08-11 DIAGNOSIS — Z3A34 34 weeks gestation of pregnancy: Secondary | ICD-10-CM | POA: Diagnosis not present

## 2022-08-11 DIAGNOSIS — O365930 Maternal care for other known or suspected poor fetal growth, third trimester, not applicable or unspecified: Secondary | ICD-10-CM

## 2022-08-11 DIAGNOSIS — O365990 Maternal care for other known or suspected poor fetal growth, unspecified trimester, not applicable or unspecified: Secondary | ICD-10-CM | POA: Diagnosis present

## 2022-08-11 NOTE — Procedures (Signed)
Kelly Moses Jan 26, 1986 [redacted]w[redacted]d  Fetus A Non-Stress Test Interpretation for 08/11/22  Indication: IUGR  Fetal Heart Rate A Mode: External Baseline Rate (A): 140 bpm Variability: Moderate Accelerations: 15 x 15 Decelerations: None Multiple birth?: No  Uterine Activity Mode: Palpation, Toco Contraction Frequency (min): none Resting Tone Palpated: Relaxed  Interpretation (Fetal Testing) Nonstress Test Interpretation: Reactive Overall Impression: Reassuring for gestational age Comments: Dr. Epimenio Sarin reviewed tracing

## 2022-08-12 ENCOUNTER — Ambulatory Visit: Payer: Medicaid Other

## 2022-08-18 ENCOUNTER — Ambulatory Visit: Payer: Medicaid Other | Attending: Obstetrics | Admitting: *Deleted

## 2022-08-18 ENCOUNTER — Ambulatory Visit: Payer: Medicaid Other | Admitting: *Deleted

## 2022-08-18 VITALS — BP 124/86 | HR 84

## 2022-08-18 DIAGNOSIS — O365931 Maternal care for other known or suspected poor fetal growth, third trimester, fetus 1: Secondary | ICD-10-CM | POA: Insufficient documentation

## 2022-08-18 DIAGNOSIS — O09523 Supervision of elderly multigravida, third trimester: Secondary | ICD-10-CM

## 2022-08-18 DIAGNOSIS — Z3A35 35 weeks gestation of pregnancy: Secondary | ICD-10-CM | POA: Diagnosis not present

## 2022-08-18 DIAGNOSIS — O10913 Unspecified pre-existing hypertension complicating pregnancy, third trimester: Secondary | ICD-10-CM | POA: Diagnosis not present

## 2022-08-18 DIAGNOSIS — O365990 Maternal care for other known or suspected poor fetal growth, unspecified trimester, not applicable or unspecified: Secondary | ICD-10-CM | POA: Diagnosis present

## 2022-08-18 NOTE — Procedures (Signed)
Kelly Moses [birth year] [redacted]w[redacted]d  Fetus A Non-Stress Test Interpretation for 08/18/22  Indication: IUGR, Chronic Hypertenstion, and Advanced Maternal Age >40 years  Fetal Heart Rate A Mode: External Baseline Rate (A): 140 bpm Variability: Moderate Accelerations: 15 x 15 Decelerations: None Multiple birth?: No  Uterine Activity Mode: Toco Contraction Frequency (min): occas Contraction Duration (sec): 50-70 Contraction Quality: Mild (not felt by pt) Resting Tone Palpated: Relaxed  Interpretation (Fetal Testing) Nonstress Test Interpretation: Reactive Overall Impression: Reassuring for gestational age Comments: tracing reviewed by Dr. Annamaria Boots

## 2022-08-19 ENCOUNTER — Ambulatory Visit: Payer: Medicaid Other

## 2022-08-25 ENCOUNTER — Ambulatory Visit: Payer: Medicaid Other | Attending: Obstetrics and Gynecology | Admitting: *Deleted

## 2022-08-25 ENCOUNTER — Ambulatory Visit: Payer: Medicaid Other | Admitting: *Deleted

## 2022-08-25 VITALS — BP 125/87 | HR 83

## 2022-08-25 DIAGNOSIS — O09523 Supervision of elderly multigravida, third trimester: Secondary | ICD-10-CM | POA: Diagnosis not present

## 2022-08-25 DIAGNOSIS — O283 Abnormal ultrasonic finding on antenatal screening of mother: Secondary | ICD-10-CM

## 2022-08-25 DIAGNOSIS — O365990 Maternal care for other known or suspected poor fetal growth, unspecified trimester, not applicable or unspecified: Secondary | ICD-10-CM | POA: Diagnosis present

## 2022-08-25 DIAGNOSIS — O365931 Maternal care for other known or suspected poor fetal growth, third trimester, fetus 1: Secondary | ICD-10-CM

## 2022-08-25 DIAGNOSIS — Z3A37 37 weeks gestation of pregnancy: Secondary | ICD-10-CM

## 2022-08-25 DIAGNOSIS — O365930 Maternal care for other known or suspected poor fetal growth, third trimester, not applicable or unspecified: Secondary | ICD-10-CM

## 2022-08-26 NOTE — Procedures (Signed)
Kelly Moses 05-22-1986 [redacted]w[redacted]d  Fetus A Non-Stress Test Interpretation for 08/26/22  Indication: IUGR, Chronic Hypertenstion, and Advanced Maternal Age >40 years  Fetal Heart Rate A Mode: External Baseline Rate (A): 140 bpm Variability: Moderate Accelerations: 15 x 15 Decelerations: None Multiple birth?: No  Uterine Activity Mode: Toco Contraction Frequency (min): none Resting Tone Palpated: Relaxed  Interpretation (Fetal Testing) Nonstress Test Interpretation: Reactive Overall Impression: Reassuring for gestational age Comments: tracing reviewed by Dt. Annamaria Boots

## 2024-07-06 ENCOUNTER — Other Ambulatory Visit: Payer: Self-pay | Admitting: Otolaryngology

## 2024-07-06 DIAGNOSIS — H903 Sensorineural hearing loss, bilateral: Secondary | ICD-10-CM

## 2024-07-25 ENCOUNTER — Ambulatory Visit
Admission: RE | Admit: 2024-07-25 | Discharge: 2024-07-25 | Disposition: A | Discharge status: Home / Self Care | Source: Ambulatory Visit | Attending: Otolaryngology | Admitting: Otolaryngology

## 2024-07-25 DIAGNOSIS — H903 Sensorineural hearing loss, bilateral: Secondary | ICD-10-CM

## 2024-07-25 MED ORDER — GADOPICLENOL 0.5 MMOL/ML IV SOLN
6.0000 mL | Freq: Once | INTRAVENOUS | Status: AC | PRN
  Administered 2024-07-25: 6 mL via INTRAVENOUS
